# Patient Record
Sex: Female | Born: 1985 | Race: White | Hispanic: No | Smoking: Current every day smoker
Health system: Southern US, Community
[De-identification: ages and names within clinical notes are randomized; demographics above are authoritative.]

## PROBLEM LIST (undated history)

## (undated) DIAGNOSIS — K219 Gastro-esophageal reflux disease without esophagitis: Secondary | ICD-10-CM

## (undated) DIAGNOSIS — G932 Benign intracranial hypertension: Secondary | ICD-10-CM

## (undated) HISTORY — PX: OTHER SURGICAL HISTORY: SHX169

---
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
## 2010-10-12 ENCOUNTER — Inpatient Hospital Stay (HOSPITAL_COMMUNITY)
Admission: AD | Admit: 2010-10-12 | Discharge: 2010-10-16 | Payer: Self-pay | Source: Home / Self Care | Attending: Obstetrics and Gynecology | Admitting: Obstetrics and Gynecology

## 2010-10-14 ENCOUNTER — Encounter (INDEPENDENT_AMBULATORY_CARE_PROVIDER_SITE_OTHER): Payer: Self-pay | Admitting: Obstetrics & Gynecology

## 2010-10-15 LAB — CBC
HCT: 22 % — ABNORMAL LOW (ref 36.0–46.0)
Hemoglobin: 7.1 g/dL — ABNORMAL LOW (ref 12.0–15.0)
MCH: 28.7 pg (ref 26.0–34.0)
MCHC: 32.3 g/dL (ref 30.0–36.0)
MCV: 89.1 fL (ref 78.0–100.0)
Platelets: 179 10*3/uL (ref 150–400)
RBC: 2.47 MIL/uL — ABNORMAL LOW (ref 3.87–5.11)
RDW: 14.8 % (ref 11.5–15.5)
WBC: 14.5 10*3/uL — ABNORMAL HIGH (ref 4.0–10.5)

## 2010-10-25 LAB — CBC
HCT: 22.2 % — ABNORMAL LOW (ref 36.0–46.0)
Hemoglobin: 7.2 g/dL — ABNORMAL LOW (ref 12.0–15.0)
MCH: 28.9 pg (ref 26.0–34.0)
MCHC: 32.4 g/dL (ref 30.0–36.0)
MCV: 89.2 fL (ref 78.0–100.0)
Platelets: 218 10*3/uL (ref 150–400)
RBC: 2.49 MIL/uL — ABNORMAL LOW (ref 3.87–5.11)
RDW: 14.8 % (ref 11.5–15.5)
WBC: 11.8 10*3/uL — ABNORMAL HIGH (ref 4.0–10.5)

## 2010-11-05 NOTE — Discharge Summary (Signed)
Paula Burgess, Paula Burgess              ACCOUNT NO.:  [id number]  MEDICAL RECORD NO.:  [id number]          PATIENT TYPE:  INP  LOCATION:  9132                          FACILITY:  [facility]  PHYSICIAN:  Lenoard Aden, M.D.DATE OF BIRTH:  Dec 07, 1985  DATE OF ADMISSION:  10/12/2010 DATE OF DISCHARGE:  10/16/2010                              DISCHARGE SUMMARY   DIAGNOSES:  A 39 weeks' gestation, asymmetric intrauterine growth restriction, oligohydramnios.  DISCHARGE DIAGNOSES:  Postoperative day 2, status post cesarean section, acute blood loss anemia.  PATIENT HISTORY:  The patient is a 25 year old, gravida 1, para 0, at 38 weeks and 6 days with an [facility] of October 19, 2010.  Prenatal care at [facility] since 7 weeks' gestation with Dr. Billy Coast as primary.  Prenatal labs include A positive, rubella positive, HIV negative, RPR negative, hepatitis B negative and GBS positive.  Prenatal course has been complicated by IUGR at the 15th percentile with a 2-week lag and AC down to the 1%.  AFI on day of admission 5.0 consistent with oligohydramnios.  The patient has no significant medical and surgical history.  The patient has no known drug allergies and current medications include a prenatal vitamin 1 tablet daily.  PRESENTATION:  The patient presents for two stage induction of labor  with cervical ripening followed by Pitocin low-dose protocol. Fetal heart rate tracing was reactive and cervical exam is 1-2-cm, 50% vertex at a -2 station.  Admission CBC; white blood cell count 11.6, hemoglobin 11.3, hematocrit 34.1 and a platelet count of 266.  Cervidil was placed on the p.m. of admission.  On the following morning at 0800, artificial rupture of membranes with scant amount of fluid return.  The patient is 2-cm, 60% vertex at -1 station.  The patient is initiated on penicillin protocol for positive GBS intrapartum. Onset of active labor noted at 1300 with epidural anesthesia for pain  management. Active labor progression at 2120 to anterior cervical rim with persistent fetal heart rate variable decels. Decision is made to proceed to cesarean section at 2320 due to fetal heart rate decels - nonreassuring fetal heart rate pattern.   PROCEDURE: Cesarean section by Dr. Billy Coast on October 14, 2010, for nonreassuring fetal heart rate tracing, delivery of a female at 0022, weight at 6 pounds 6 ounces, cord pH was 7.36 and Apgar scores of 9 and 9, newborn is transferred to the newborn nursery. See operative report for further details.  POSTOPERATIVE CARE:  The patient's postoperative care was uneventful. Postoperative CBC; white blood cell count 11.8, hemoglobin 7.2, hematocrit 22.2 and a platelet count of 218 on October 16, 2009. Physical exam was within normal limits.  The patient remains afebrile during hospital course.  Output is negative 400 at the time of discharge.  Incision is well approximated with staples.  No erythema, no ecchymosis, no drainage from the site.  The patient is breast-feeding.  DISCHARGE CARE:  The patient is discharged home on postoperative day 2 in stable condition.  Staples were intact for removal and [facility] in 4-5 days.  The patient is on a regular diet.  ACTIVITY:  Per postoperative  restrictions x2 weeks.  DISCHARGE INSTRUCTIONS:  Per [facility] discharge booklet. MEDICATION: 1) prenatal vitamin 1 tablet p.o. daily 2) ibuprofen 800mg  p.o. q.8 h. as needed for discomfort p.r.n. 3) Percocet 1-2 tablets every 4 h. p.r.n. for pain  4) iron 150 mg, Niferex p.o. daily.     Marlinda Mike, C.N.M.   ______________________________ Lenoard Aden, M.D.    TB/MEDQ  D:  10/21/2010  T:  10/22/2010  Job:  [id number]  Electronically Signed by Marlinda Mike C.N.M. on 10/25/2010 10:43:06 PM Electronically Signed by Olivia Mackie M.D. on 11/05/2010 02:24:02 PM

## 2010-12-20 LAB — CBC
HCT: 34.1 % — ABNORMAL LOW (ref 36.0–46.0)
Hemoglobin: 11.3 g/dL — ABNORMAL LOW (ref 12.0–15.0)
MCH: 29 pg (ref 26.0–34.0)
MCHC: 33.1 g/dL (ref 30.0–36.0)
MCV: 87.7 fL (ref 78.0–100.0)
Platelets: 266 10*3/uL (ref 150–400)
RBC: 3.89 MIL/uL (ref 3.87–5.11)
RDW: 14.2 % (ref 11.5–15.5)
WBC: 11.6 10*3/uL — ABNORMAL HIGH (ref 4.0–10.5)

## 2010-12-20 LAB — RPR: RPR Ser Ql: NONREACTIVE

## 2011-03-22 ENCOUNTER — Other Ambulatory Visit: Payer: Self-pay | Admitting: Obstetrics and Gynecology

## 2013-07-16 LAB — OB RESULTS CONSOLE RPR: RPR: NONREACTIVE

## 2013-07-16 LAB — OB RESULTS CONSOLE ABO/RH: RH Type: POSITIVE

## 2013-07-16 LAB — OB RESULTS CONSOLE RUBELLA ANTIBODY, IGM: Rubella: NON-IMMUNE/NOT IMMUNE

## 2013-07-16 LAB — OB RESULTS CONSOLE HIV ANTIBODY (ROUTINE TESTING): HIV: NONREACTIVE

## 2013-07-16 LAB — OB RESULTS CONSOLE ANTIBODY SCREEN: Antibody Screen: NEGATIVE

## 2013-07-16 LAB — OB RESULTS CONSOLE HEPATITIS B SURFACE ANTIGEN: Hepatitis B Surface Ag: NEGATIVE

## 2014-01-14 ENCOUNTER — Other Ambulatory Visit: Payer: Self-pay | Admitting: Obstetrics and Gynecology

## 2014-01-30 ENCOUNTER — Encounter (HOSPITAL_COMMUNITY): Payer: Self-pay | Admitting: Pharmacist

## 2014-02-10 ENCOUNTER — Encounter (HOSPITAL_COMMUNITY): Payer: Self-pay

## 2014-02-10 ENCOUNTER — Encounter (HOSPITAL_COMMUNITY)
Admission: RE | Admit: 2014-02-10 | Discharge: 2014-02-10 | Disposition: A | Discharge status: Home / Self Care | Payer: 59 | Source: Ambulatory Visit | Attending: Obstetrics and Gynecology | Admitting: Obstetrics and Gynecology

## 2014-02-10 HISTORY — DX: Gastro-esophageal reflux disease without esophagitis: K21.9

## 2014-02-10 LAB — TYPE AND SCREEN
ABO/RH(D): A POS
Antibody Screen: NEGATIVE

## 2014-02-10 LAB — CBC
HCT: 34 % — ABNORMAL LOW (ref 36.0–46.0)
Hemoglobin: 11.5 g/dL — ABNORMAL LOW (ref 12.0–15.0)
MCH: 30.6 pg (ref 26.0–34.0)
MCHC: 33.8 g/dL (ref 30.0–36.0)
MCV: 90.4 fL (ref 78.0–100.0)
Platelets: 203 10*3/uL (ref 150–400)
RBC: 3.76 MIL/uL — ABNORMAL LOW (ref 3.87–5.11)
RDW: 14.4 % (ref 11.5–15.5)
WBC: 11 10*3/uL — ABNORMAL HIGH (ref 4.0–10.5)

## 2014-02-10 LAB — ABO/RH: ABO/RH(D): A POS

## 2014-02-10 LAB — RPR

## 2014-02-10 NOTE — Patient Instructions (Signed)
[place]  02/10/2014   Your procedure is scheduled on: 02/12/14  Enter through the Main Entrance of [facility] [place] up the phone at the desk and dial [phone].   Call this number if you have problems the morning of surgery: [phone]   Remember:   Do not eat food:After Midnight.  Do not drink clear liquids: 4 Hours before arrival.  Take these medicines the morning of surgery with A SIP OF WATER: Zantac   Do not wear jewelry, make-up or nail polish.  Do not wear lotions, powders, or perfumes. You may wear deodorant.  Do not shave 48 hours prior to surgery.  Do not bring valuables to the hospital.  [facility] is not   responsible for any belongings or valuables brought to the hospital.  Contacts, dentures or bridgework may not be worn into surgery.  Leave suitcase in the car. After surgery it may be brought to your room.  For patients admitted to the hospital, checkout time is 11:00 AM the day of              discharge.   Patients discharged the day of surgery will not be allowed to drive             home.  Name and phone number of your driver: NA  Special Instructions:      Please read over the following fact sheets that you were given:   Surgical Site Infection Prevention

## 2014-02-11 NOTE — Consult Note (Signed)
NAMEKENOSHA, DOSTER NO.:  [id number]  MEDICAL RECORD NO.:  [id number]  LOCATION:                                 FACILITY:  PHYSICIAN:  Lovenia Kim, M.D.DATE OF BIRTH:  05/07/1986  DATE OF CONSULTATION: DATE OF DISCHARGE:                                CONSULTATION   CHIEF COMPLAINT:  Previous C-section for repeat.  HISTORY OF PRESENT ILLNESS:  This is a 28 year old white female, G2, P1, at [redacted] weeks gestation for repeat low-segment transverse cesarean section.  She has a family history remarkable for chronic hypertension, breast cancer, diabetes, multiple myeloma.  ALLERGIES:  No known drug allergies.  MEDICATIONS:  Prenatal vitamins and Zantac.  PAST SURGICAL HISTORY:  Previous C-section in 2012 and hernia repair.  PRENATAL COURSE:  Uncomplicated.  PHYSICAL EXAMINATION:  GENERAL:  She is a well-developed, well-nourished white female, in no acute distress. HEENT:  Normal. NECK:  Supple with full range of motion. LUNGS:  Clear. HEART:  Regular rate and rhythm. ABDOMEN:  Soft, gravid, nontender.  Cervix is closed 50%, vertex, -2. EXTREMITIES:  There are no cords. NEUROLOGIC:  Nonfocal. SKIN:  Intact.  IMPRESSION:  This is a 39-week intrauterine pregnancy for repeat C- section.  PLAN:  Proceed with repeat low-segment transverse cesarean section. Risks of anesthesia, infection, bleeding, injury to surrounding organs, possible need for repair was discussed, delayed versus immediate complications to include bowel and bladder injury noted.  The patient acknowledges and wishes to proceed.     Lovenia Kim, M.D.     RJT/MEDQ  D:  02/11/2014  T:  02/11/2014  Job:  [id number]

## 2014-02-12 ENCOUNTER — Encounter (HOSPITAL_COMMUNITY): Payer: Self-pay

## 2014-02-12 ENCOUNTER — Encounter (HOSPITAL_COMMUNITY)
Admission: AD | Disposition: A | Discharge status: Home / Self Care | Payer: Self-pay | Source: Ambulatory Visit | Attending: Obstetrics and Gynecology

## 2014-02-12 ENCOUNTER — Encounter (HOSPITAL_COMMUNITY): Payer: 59 | Admitting: Certified Registered"

## 2014-02-12 ENCOUNTER — Inpatient Hospital Stay (HOSPITAL_COMMUNITY): Payer: 59 | Admitting: Certified Registered"

## 2014-02-12 ENCOUNTER — Inpatient Hospital Stay (HOSPITAL_COMMUNITY)
Admission: AD | Admit: 2014-02-12 | Discharge: 2014-02-14 | DRG: 766 | Disposition: A | Discharge status: Home / Self Care | Payer: 59 | Source: Ambulatory Visit | Attending: Obstetrics and Gynecology | Admitting: Obstetrics and Gynecology

## 2014-02-12 DIAGNOSIS — E669 Obesity, unspecified: Secondary | ICD-10-CM | POA: Diagnosis present

## 2014-02-12 DIAGNOSIS — Z833 Family history of diabetes mellitus: Secondary | ICD-10-CM

## 2014-02-12 DIAGNOSIS — O99214 Obesity complicating childbirth: Secondary | ICD-10-CM

## 2014-02-12 DIAGNOSIS — O99334 Smoking (tobacco) complicating childbirth: Secondary | ICD-10-CM | POA: Diagnosis present

## 2014-02-12 DIAGNOSIS — O34219 Maternal care for unspecified type scar from previous cesarean delivery: Principal | ICD-10-CM | POA: Diagnosis present

## 2014-02-12 DIAGNOSIS — Z6838 Body mass index (BMI) 38.0-38.9, adult: Secondary | ICD-10-CM

## 2014-02-12 DIAGNOSIS — Z803 Family history of malignant neoplasm of breast: Secondary | ICD-10-CM

## 2014-02-12 SURGERY — Surgical Case
Anesthesia: Spinal | Site: Abdomen

## 2014-02-12 MED ORDER — ONDANSETRON HCL 4 MG/2ML IJ SOLN: 4.0000 mg | Freq: Three times a day (TID) | INTRAMUSCULAR | Status: DC | PRN

## 2014-02-12 MED ORDER — MORPHINE SULFATE (PF) 0.5 MG/ML IJ SOLN
INTRAMUSCULAR | Status: DC | PRN
  Administered 2014-02-12: .15 mg via INTRATHECAL

## 2014-02-12 MED ORDER — SIMETHICONE 80 MG PO CHEW
80.0000 mg | CHEWABLE_TABLET | ORAL | Status: DC
  Administered 2014-02-13 (×2): 80 mg via ORAL
  Filled 2014-02-12 (×2): qty 1

## 2014-02-12 MED ORDER — SIMETHICONE 80 MG PO CHEW: 80.0000 mg | CHEWABLE_TABLET | ORAL | Status: DC | PRN

## 2014-02-12 MED ORDER — SCOPOLAMINE 1 MG/3DAYS TD PT72
1.0000 | MEDICATED_PATCH | Freq: Once | TRANSDERMAL | Status: DC
  Administered 2014-02-12: 1.5 mg via TRANSDERMAL

## 2014-02-12 MED ORDER — LACTATED RINGERS IV SOLN
Freq: Once | INTRAVENOUS | Status: AC
  Administered 2014-02-12: 12:00:00 via INTRAVENOUS

## 2014-02-12 MED ORDER — IBUPROFEN 600 MG PO TABS
600.0000 mg | ORAL_TABLET | Freq: Four times a day (QID) | ORAL | Status: DC
  Administered 2014-02-13 – 2014-02-14 (×7): 600 mg via ORAL
  Filled 2014-02-12 (×7): qty 1

## 2014-02-12 MED ORDER — NALBUPHINE HCL 10 MG/ML IJ SOLN
5.0000 mg | INTRAMUSCULAR | Status: DC | PRN
  Administered 2014-02-12 (×2): 5 mg via SUBCUTANEOUS

## 2014-02-12 MED ORDER — DIPHENHYDRAMINE HCL 50 MG/ML IJ SOLN: 12.5000 mg | INTRAMUSCULAR | Status: DC | PRN

## 2014-02-12 MED ORDER — SODIUM CHLORIDE 0.9 % IJ SOLN: 3.0000 mL | INTRAMUSCULAR | Status: DC | PRN

## 2014-02-12 MED ORDER — CEFAZOLIN SODIUM-DEXTROSE 2-3 GM-% IV SOLR
INTRAVENOUS | Status: AC
  Filled 2014-02-12: qty 50

## 2014-02-12 MED ORDER — TETANUS-DIPHTH-ACELL PERTUSSIS 5-2.5-18.5 LF-MCG/0.5 IM SUSP: 0.5000 mL | Freq: Once | INTRAMUSCULAR | Status: DC

## 2014-02-12 MED ORDER — SIMETHICONE 80 MG PO CHEW
80.0000 mg | CHEWABLE_TABLET | Freq: Three times a day (TID) | ORAL | Status: DC
  Administered 2014-02-13 (×3): 80 mg via ORAL
  Filled 2014-02-12 (×4): qty 1

## 2014-02-12 MED ORDER — KETOROLAC TROMETHAMINE 30 MG/ML IJ SOLN: 30.0000 mg | Freq: Four times a day (QID) | INTRAMUSCULAR | Status: AC | PRN

## 2014-02-12 MED ORDER — SENNOSIDES-DOCUSATE SODIUM 8.6-50 MG PO TABS
2.0000 | ORAL_TABLET | ORAL | Status: DC
  Administered 2014-02-13 (×2): 2 via ORAL
  Filled 2014-02-12 (×2): qty 2

## 2014-02-12 MED ORDER — METHYLERGONOVINE MALEATE 0.2 MG/ML IJ SOLN: 0.2000 mg | INTRAMUSCULAR | Status: DC | PRN

## 2014-02-12 MED ORDER — LACTATED RINGERS IV SOLN: INTRAVENOUS | Status: DC

## 2014-02-12 MED ORDER — BUPIVACAINE HCL (PF) 0.25 % IJ SOLN
INTRAMUSCULAR | Status: AC
  Filled 2014-02-12: qty 30

## 2014-02-12 MED ORDER — BUPIVACAINE HCL (PF) 0.25 % IJ SOLN
INTRAMUSCULAR | Status: DC | PRN
  Administered 2014-02-12: 10 mL

## 2014-02-12 MED ORDER — DIPHENHYDRAMINE HCL 50 MG/ML IJ SOLN: 25.0000 mg | INTRAMUSCULAR | Status: DC | PRN

## 2014-02-12 MED ORDER — KETOROLAC TROMETHAMINE 30 MG/ML IJ SOLN
30.0000 mg | Freq: Four times a day (QID) | INTRAMUSCULAR | Status: AC | PRN
  Administered 2014-02-12: 30 mg via INTRAVENOUS

## 2014-02-12 MED ORDER — PRENATAL MULTIVITAMIN CH
1.0000 | ORAL_TABLET | Freq: Every day | ORAL | Status: DC
  Administered 2014-02-13 – 2014-02-14 (×2): 1 via ORAL
  Filled 2014-02-12 (×2): qty 1

## 2014-02-12 MED ORDER — NALBUPHINE HCL 10 MG/ML IJ SOLN: 5.0000 mg | INTRAMUSCULAR | Status: DC | PRN

## 2014-02-12 MED ORDER — OXYCODONE-ACETAMINOPHEN 5-325 MG PO TABS
1.0000 | ORAL_TABLET | ORAL | Status: DC | PRN
  Administered 2014-02-13 – 2014-02-14 (×6): 1 via ORAL
  Filled 2014-02-12 (×6): qty 1

## 2014-02-12 MED ORDER — FENTANYL CITRATE 0.05 MG/ML IJ SOLN
INTRAMUSCULAR | Status: DC | PRN
  Administered 2014-02-12: 25 ug via INTRATHECAL

## 2014-02-12 MED ORDER — BUPIVACAINE IN DEXTROSE 0.75-8.25 % IT SOLN
INTRATHECAL | Status: DC | PRN
  Administered 2014-02-12: 1.5 mL via INTRATHECAL

## 2014-02-12 MED ORDER — LACTATED RINGERS IV SOLN
INTRAVENOUS | Status: DC
  Administered 2014-02-12 (×2): via INTRAVENOUS

## 2014-02-12 MED ORDER — METOCLOPRAMIDE HCL 5 MG/ML IJ SOLN: 10.0000 mg | Freq: Three times a day (TID) | INTRAMUSCULAR | Status: DC | PRN

## 2014-02-12 MED ORDER — OXYTOCIN 40 UNITS IN LACTATED RINGERS INFUSION - SIMPLE MED
62.5000 mL/h | INTRAVENOUS | Status: AC
Start: 2014-02-12 — End: 2014-02-13

## 2014-02-12 MED ORDER — LANOLIN HYDROUS EX OINT: 1.0000 "application " | TOPICAL_OINTMENT | CUTANEOUS | Status: DC | PRN

## 2014-02-12 MED ORDER — ONDANSETRON HCL 4 MG/2ML IJ SOLN: 4.0000 mg | INTRAMUSCULAR | Status: DC | PRN

## 2014-02-12 MED ORDER — NALOXONE HCL 1 MG/ML IJ SOLN
1.0000 ug/kg/h | INTRAVENOUS | Status: DC | PRN
  Filled 2014-02-12: qty 2

## 2014-02-12 MED ORDER — SCOPOLAMINE 1 MG/3DAYS TD PT72: 1.0000 | MEDICATED_PATCH | Freq: Once | TRANSDERMAL | Status: DC

## 2014-02-12 MED ORDER — DIBUCAINE 1 % RE OINT: 1.0000 "application " | TOPICAL_OINTMENT | RECTAL | Status: DC | PRN

## 2014-02-12 MED ORDER — ZOLPIDEM TARTRATE 5 MG PO TABS: 5.0000 mg | ORAL_TABLET | Freq: Every evening | ORAL | Status: DC | PRN

## 2014-02-12 MED ORDER — DIPHENHYDRAMINE HCL 25 MG PO CAPS
25.0000 mg | ORAL_CAPSULE | ORAL | Status: DC | PRN
  Administered 2014-02-12: 25 mg via ORAL
  Filled 2014-02-12 (×2): qty 1

## 2014-02-12 MED ORDER — ONDANSETRON HCL 4 MG PO TABS: 4.0000 mg | ORAL_TABLET | ORAL | Status: DC | PRN

## 2014-02-12 MED ORDER — OXYTOCIN 10 UNIT/ML IJ SOLN
40.0000 [IU] | INTRAVENOUS | Status: DC | PRN
  Administered 2014-02-12: 40 [IU] via INTRAVENOUS

## 2014-02-12 MED ORDER — SCOPOLAMINE 1 MG/3DAYS TD PT72
MEDICATED_PATCH | TRANSDERMAL | Status: AC
  Administered 2014-02-12: 1.5 mg via TRANSDERMAL
  Filled 2014-02-12: qty 1

## 2014-02-12 MED ORDER — NALOXONE HCL 0.4 MG/ML IJ SOLN: 0.4000 mg | INTRAMUSCULAR | Status: DC | PRN

## 2014-02-12 MED ORDER — ONDANSETRON HCL 4 MG/2ML IJ SOLN
INTRAMUSCULAR | Status: DC | PRN
  Administered 2014-02-12: 4 mg via INTRAVENOUS

## 2014-02-12 MED ORDER — CEFAZOLIN SODIUM-DEXTROSE 2-3 GM-% IV SOLR
2.0000 g | INTRAVENOUS | Status: AC
  Administered 2014-02-12: 2 g via INTRAVENOUS

## 2014-02-12 MED ORDER — FENTANYL CITRATE 0.05 MG/ML IJ SOLN
25.0000 ug | INTRAMUSCULAR | Status: DC | PRN
  Administered 2014-02-12 (×2): 50 ug via INTRAVENOUS

## 2014-02-12 MED ORDER — LACTATED RINGERS IV SOLN
INTRAVENOUS | Status: DC | PRN
  Administered 2014-02-12: 14:00:00 via INTRAVENOUS

## 2014-02-12 MED ORDER — MEPERIDINE HCL 25 MG/ML IJ SOLN: 6.2500 mg | INTRAMUSCULAR | Status: DC | PRN

## 2014-02-12 MED ORDER — MENTHOL 3 MG MT LOZG: 1.0000 | LOZENGE | OROMUCOSAL | Status: DC | PRN

## 2014-02-12 MED ORDER — METHYLERGONOVINE MALEATE 0.2 MG PO TABS: 0.2000 mg | ORAL_TABLET | ORAL | Status: DC | PRN

## 2014-02-12 MED ORDER — PHENYLEPHRINE 8 MG IN D5W 100 ML (0.08MG/ML) PREMIX OPTIME
INJECTION | INTRAVENOUS | Status: DC | PRN
  Administered 2014-02-12: 60 ug/min via INTRAVENOUS

## 2014-02-12 MED ORDER — DIPHENHYDRAMINE HCL 25 MG PO CAPS: 25.0000 mg | ORAL_CAPSULE | Freq: Four times a day (QID) | ORAL | Status: DC | PRN

## 2014-02-12 MED ORDER — WITCH HAZEL-GLYCERIN EX PADS: 1.0000 "application " | MEDICATED_PAD | CUTANEOUS | Status: DC | PRN

## 2014-02-12 SURGICAL SUPPLY — 37 items
CLAMP CORD UMBIL (MISCELLANEOUS) IMPLANT
CLOTH BEACON ORANGE TIMEOUT ST (SAFETY) ×3 IMPLANT
CONTAINER PREFILL 10% NBF 15ML (MISCELLANEOUS) IMPLANT
DERMABOND ADVANCED (GAUZE/BANDAGES/DRESSINGS) ×2
DERMABOND ADVANCED .7 DNX12 (GAUZE/BANDAGES/DRESSINGS) ×1 IMPLANT
DRAPE LG THREE QUARTER DISP (DRAPES) IMPLANT
DRSG OPSITE POSTOP 4X10 (GAUZE/BANDAGES/DRESSINGS) ×3 IMPLANT
DURAPREP 26ML APPLICATOR (WOUND CARE) ×3 IMPLANT
ELECT REM PT RETURN 9FT ADLT (ELECTROSURGICAL) ×3
ELECTRODE REM PT RTRN 9FT ADLT (ELECTROSURGICAL) ×1 IMPLANT
EXTRACTOR VACUUM M CUP 4 TUBE (SUCTIONS) ×2 IMPLANT
EXTRACTOR VACUUM M CUP 4' TUBE (SUCTIONS) ×1
GLOVE BIO SURGEON STRL SZ7.5 (GLOVE) ×3 IMPLANT
GLOVE BIOGEL M 7.0 STRL (GLOVE) ×6 IMPLANT
GOWN STRL REUS W/TWL LRG LVL3 (GOWN DISPOSABLE) ×6 IMPLANT
KIT ABG SYR 3ML LUER SLIP (SYRINGE) IMPLANT
NEEDLE HYPO 25X1 1.5 SAFETY (NEEDLE) ×3 IMPLANT
NEEDLE HYPO 25X5/8 SAFETYGLIDE (NEEDLE) IMPLANT
NEEDLE SPNL 20GX3.5 QUINCKE YW (NEEDLE) IMPLANT
NS IRRIG 1000ML POUR BTL (IV SOLUTION) ×3 IMPLANT
PACK C SECTION WH (CUSTOM PROCEDURE TRAY) ×3 IMPLANT
PAD ABD 8X7 1/2 STERILE (GAUZE/BANDAGES/DRESSINGS) ×3 IMPLANT
STAPLER VISISTAT 35W (STAPLE) IMPLANT
SUT MNCRL 0 VIOLET CTX 36 (SUTURE) ×2 IMPLANT
SUT MNCRL AB 3-0 PS2 27 (SUTURE) ×3 IMPLANT
SUT MON AB 2-0 CT1 27 (SUTURE) ×3 IMPLANT
SUT MON AB-0 CT1 36 (SUTURE) ×6 IMPLANT
SUT MONOCRYL 0 CTX 36 (SUTURE) ×4
SUT PLAIN 0 NONE (SUTURE) IMPLANT
SUT PLAIN 2 0 (SUTURE)
SUT PLAIN 2 0 XLH (SUTURE) ×3 IMPLANT
SUT PLAIN ABS 2-0 CT1 27XMFL (SUTURE) IMPLANT
SYR 20CC LL (SYRINGE) IMPLANT
SYR CONTROL 10ML LL (SYRINGE) ×3 IMPLANT
TOWEL OR 17X24 6PK STRL BLUE (TOWEL DISPOSABLE) ×3 IMPLANT
TRAY FOLEY CATH 14FR (SET/KITS/TRAYS/PACK) ×3 IMPLANT
WATER STERILE IRR 1000ML POUR (IV SOLUTION) ×3 IMPLANT

## 2014-02-12 NOTE — Anesthesia Postprocedure Evaluation (Signed)
  Anesthesia Post-op Note  Patient: Paula Burgess  Procedure(s) Performed: Procedure(s): Repeat CESAREAN SECTION (N/A)  Patient Location: PACU  Anesthesia Type:Spinal  Level of Consciousness: awake, alert  and oriented  Airway and Oxygen Therapy: Patient Spontanous Breathing  Post-op Pain: none  Post-op Assessment: Post-op Vital signs reviewed, Patient's Cardiovascular Status Stable, Respiratory Function Stable, Patent Airway, No signs of Nausea or vomiting, Pain level controlled, No headache and No backache  Post-op Vital Signs: Reviewed and stable  Last Vitals:  Filed Vitals:   02/12/14 1515  BP: 97/58  Pulse: 75  Temp:   Resp: 18    Complications: No apparent anesthesia complications

## 2014-02-12 NOTE — Transfer of Care (Signed)
Immediate Anesthesia Transfer of Care Note  Patient: Paula Burgess  Procedure(s) Performed: Procedure(s): Repeat CESAREAN SECTION (N/A)  Patient Location: PACU  Anesthesia Type:Spinal  Level of Consciousness: awake, alert  and oriented  Airway & Oxygen Therapy: Patient Spontanous Breathing  Post-op Assessment: Report given to PACU RN and Post -op Vital signs reviewed and stable  Post vital signs: Reviewed and stable  Complications: No apparent anesthesia complications

## 2014-02-12 NOTE — Progress Notes (Signed)
Patient ID: Paula Burgess, female   DOB: [birth year], 28 y.o.   MRN: [id number] Patient seen and examined. Consent witnessed and signed. No changes noted. Update completed. CBC    Component Value Date/Time   WBC 11.0* 02/10/2014 1150   RBC 3.76* 02/10/2014 1150   HGB 11.5* 02/10/2014 1150   HCT 34.0* 02/10/2014 1150   PLT 203 02/10/2014 1150   MCV 90.4 02/10/2014 1150   MCH 30.6 02/10/2014 1150   MCHC 33.8 02/10/2014 1150   RDW 14.4 02/10/2014 1150

## 2014-02-12 NOTE — Anesthesia Preprocedure Evaluation (Signed)
Anesthesia Evaluation  Patient identified by MRN, date of birth, ID band Patient awake    Reviewed: Allergy & Precautions, H&P , Patient's Chart, lab work & pertinent test results  Airway Mallampati: II TM Distance: >3 FB Neck ROM: full    Dental no notable dental hx.    Pulmonary Current Smoker,  breath sounds clear to auscultation  Pulmonary exam normal       Cardiovascular Exercise Tolerance: Good Rhythm:regular Rate:Normal     Neuro/Psych    GI/Hepatic   Endo/Other  Morbid obesity  Renal/GU      Musculoskeletal   Abdominal   Peds  Hematology   Anesthesia Other Findings   Reproductive/Obstetrics                           Anesthesia Physical Anesthesia Plan  ASA: III  Anesthesia Plan: Spinal   Post-op Pain Management:    Induction:   Airway Management Planned:   Additional Equipment:   Intra-op Plan:   Post-operative Plan:   Informed Consent: I have reviewed the patients History and Physical, chart, labs and discussed the procedure including the risks, benefits and alternatives for the proposed anesthesia with the patient or authorized representative who has indicated his/her understanding and acceptance.   Dental Advisory Given  Plan Discussed with: CRNA  Anesthesia Plan Comments: (Lab work confirmed with CRNA in room. Platelets okay. Discussed spinal anesthetic, and patient consents to the procedure:  included risk of possible headache,backache, failed block, allergic reaction, and nerve injury. This patient was asked if she had any questions or concerns before the procedure started. )        Anesthesia Quick Evaluation

## 2014-02-12 NOTE — Anesthesia Procedure Notes (Signed)
Spinal  Patient location during procedure: OR Start time: 02/12/2014 1:14 PM Staffing Anesthesiologist: Gabriella Guile A. Performed by: anesthesiologist  Preanesthetic Checklist Completed: patient identified, site marked, surgical consent, pre-op evaluation, timeout performed, IV checked, risks and benefits discussed and monitors and equipment checked Spinal Block Patient position: sitting Prep: site prepped and draped and DuraPrep Patient monitoring: heart rate, cardiac monitor, continuous pulse ox and blood pressure Approach: midline Location: L3-4 Injection technique: single-shot Needle Needle type: Sprotte  Needle gauge: 24 G Needle length: 9 cm Needle insertion depth: 6 cm Assessment Sensory level: T4 Additional Notes Patient tolerated procedure well. Adequate sensory level.

## 2014-02-12 NOTE — Op Note (Signed)
Cesarean Section Procedure Note  Indications: previous uterine incision kerr x one  Pre-operative Diagnosis: 39 week 1 day pregnancy.  Post-operative Diagnosis: same  Surgeon: Lovenia Kim   Assistants: Renato Battles, CNM  Anesthesia: Local anesthesia 0.25.% bupivacaine and Spinal anesthesia  ASA Class: 2  Procedure Details  The patient was seen in the Holding Room. The risks, benefits, complications, treatment options, and expected outcomes were discussed with the patient.  The patient concurred with the proposed plan, giving informed consent. The risks of anesthesia, infection, bleeding and possible injury to other organs discussed. Injury to bowel, bladder, or ureter with possible need for repair discussed. Possible need for transfusion with secondary risks of hepatitis or HIV acquisition discussed. Post operative complications to include but not limited to DVT, PE and Pneumonia noted. The site of surgery properly noted/marked. The patient was taken to Operating Room # 9, identified as Paula Burgess and the procedure verified as C-Section Delivery. A Time Out was held and the above information confirmed.  After induction of anesthesia, the patient was draped and prepped in the usual sterile manner. A Pfannenstiel incision was made and carried down through the subcutaneous tissue to the fascia. Fascial incision was made and extended transversely using Mayo scissors. The fascia was separated from the underlying rectus tissue superiorly and inferiorly. The peritoneum was identified and entered. Peritoneal incision was extended longitudinally. The utero-vesical peritoneal reflection was incised transversely and the bladder flap was bluntly freed from the lower uterine segment. A low transverse uterine incision(Kerr hysterotomy) was made. Delivered from OA presentation with vacuum assistance was a  female with Apgar scores of 8 at one minute and 9 at five minutes. Bulb suctioning gently performed.  Neonatal team in attendance.After the umbilical cord was clamped and cut cord blood was obtained for evaluation. The placenta was removed intact and appeared normal. The uterus was curetted with a dry lap pack. Good hemostasis was noted.The uterine outline, tubes and ovaries appeared normal. The uterine incision was closed with running locked sutures of 0 Monocryl x 2 layers. Hemostasis was observed. Lavage was carried out until clear.The parietal peritoneum was closed with a running 2-0 Monocryl suture. The fascia was then reapproximated with running sutures of 0 Monocryl. The skin was reapproximated with 3-0 monocryl after [place] closure with 2-0 plain.  Instrument, sponge, and needle counts were correct prior the abdominal closure and at the conclusion of the case.   Findings: FTLF, anterior placenta Omental adhesion, right tubal adhesions  Estimated Blood Loss:  300 mL         Drains: foley                 Specimens: placenta                 Complications:  None; patient tolerated the procedure well.         Disposition: PACU - hemodynamically stable.         Condition: stable  Attending Attestation: I performed the procedure.

## 2014-02-13 ENCOUNTER — Encounter (HOSPITAL_COMMUNITY): Payer: Self-pay | Admitting: Obstetrics and Gynecology

## 2014-02-13 LAB — CBC
HCT: 24.9 % — ABNORMAL LOW (ref 36.0–46.0)
Hemoglobin: 8.3 g/dL — ABNORMAL LOW (ref 12.0–15.0)
MCH: 30.2 pg (ref 26.0–34.0)
MCHC: 33.3 g/dL (ref 30.0–36.0)
MCV: 90.5 fL (ref 78.0–100.0)
PLATELETS: 172 10*3/uL (ref 150–400)
RBC: 2.75 MIL/uL — AB (ref 3.87–5.11)
RDW: 14.5 % (ref 11.5–15.5)
WBC: 12.5 10*3/uL — ABNORMAL HIGH (ref 4.0–10.5)

## 2014-02-13 LAB — BIRTH TISSUE RECOVERY COLLECTION (PLACENTA DONATION)

## 2014-02-13 NOTE — Progress Notes (Signed)
Patient ID: Paula Burgess, female   DOB: 1986/09/12, 28 y.o.   MRN: [id number] POD # 1  Subjective: Pt reports feeling well / Pain controlled with ibuprofen and percocet Tolerating po/ Foley d/c'ed and voiding without problems/ No n/v/Flatus pos Activity: out of bed and ambulate Bleeding is light Newborn info:  Information for the patient's newborn:  Varonica, Siharath [[id number]]  female Feeding: breast   Objective: VS: Blood pressure 105/73, pulse 85, temperature 98 F (36.7 C), temperature source Oral, resp. rate 20, weight 100.245 kg (221 lb), SpO2 99.00%, unknown if currently breastfeeding.    Intake/Output Summary (Last 24 hours) at 02/13/14 1123 Last data filed at 02/13/14 0600  Gross per 24 hour  Intake   2065 ml  Output   2100 ml  Net    -35 ml      Recent Labs  02/10/14 1150 02/13/14 0556  WBC 11.0* 12.5*  HGB 11.5* 8.3*  HCT 34.0* 24.9*  PLT 203 172    Blood type: --/--/A POS, A POS (05/04 1150) Rubella: Nonimmune (10/07 1147)    Physical Exam:  General: alert, cooperative and no distress CV: Regular rate and rhythm Resp: clear Abdomen: soft, nontender, normal bowel sounds Incision: Covered with Tegaderm and honeycomb dressing; well approximated. Uterine Fundus: firm, below umbilicus, nontender Lochia: minimal Ext: edema trace and Homans sign is negative, no sign of DVT    A/P: POD # 1 G2P2002 S/P C/Section d/t elective repeat ABL anemia, compounded by chronic anemia Doing well Continue routine post op orders   Signed: Darleen Crocker, MSN, [facility] 02/13/2014, 11:23 AM

## 2014-02-13 NOTE — Addendum Note (Signed)
Addendum created 02/13/14 0750 by Asher Muir, CRNA   Modules edited: Notes Section   Notes Section:  File: [id number]

## 2014-02-13 NOTE — Anesthesia Postprocedure Evaluation (Signed)
Anesthesia Post Note  Patient: Paula Burgess  Procedure(s) Performed: Procedure(s) (LRB): Repeat CESAREAN SECTION (N/A)  Anesthesia type: Spinal  Patient location: Mother/Baby  Post pain: Pain level controlled  Post assessment: Post-op Vital signs reviewed  Last Vitals:  Filed Vitals:   02/13/14 0600  BP: 100/58  Pulse: 72  Temp: 36.6 C  Resp: 20    Post vital signs: Reviewed  Level of consciousness: awake  Complications: No apparent anesthesia complications

## 2014-02-14 LAB — URINALYSIS, ROUTINE W REFLEX MICROSCOPIC
Bilirubin Urine: NEGATIVE
Glucose, UA: NEGATIVE mg/dL
Ketones, ur: NEGATIVE mg/dL
Leukocytes, UA: NEGATIVE
Nitrite: NEGATIVE
Protein, ur: NEGATIVE mg/dL
Specific Gravity, Urine: >1.03 — ABNORMAL HIGH (ref 1.005–1.030)
Urobilinogen, UA: 0.2 mg/dL (ref 0.0–1.0)
pH: 6 (ref 5.0–8.0)

## 2014-02-14 LAB — URINE MICROSCOPIC-ADD ON

## 2014-02-14 MED ORDER — OXYCODONE-ACETAMINOPHEN 5-325 MG PO TABS: 1.0000 | ORAL_TABLET | ORAL | Status: DC | PRN

## 2014-02-14 MED ORDER — NITROFURANTOIN MONOHYD MACRO 100 MG PO CAPS: 100.0000 mg | ORAL_CAPSULE | Freq: Two times a day (BID) | ORAL | Status: AC

## 2014-02-14 MED ORDER — DOCUSATE SODIUM 100 MG PO CAPS: 100.0000 mg | ORAL_CAPSULE | Freq: Two times a day (BID) | ORAL | Status: DC | PRN

## 2014-02-14 MED ORDER — IBUPROFEN 600 MG PO TABS: 600.0000 mg | ORAL_TABLET | Freq: Four times a day (QID) | ORAL | Status: DC | PRN

## 2014-02-14 NOTE — Progress Notes (Signed)
Patient ID: Paula Burgess, female   DOB: 07-11-1986, 28 y.o.   MRN: [id number] POD # 2  Subjective: Pt reports feeling well and desires early d/c home / Pain controlled with ibuprofen and percocet C/O dull pain with urination.  No urgency or frequency and no LBP.  Afebrile. Tolerating po/ No n/v/Flatus pos Activity: out of bed and ambulate Bleeding is light Newborn info:  Information for the patient's newborn:  Janin, Kozlowski [[id number]]  female Feeding: breast   Objective: VS: Blood pressure 94/77, pulse 80, temperature 98.2 F (36.8 C), temperature source Oral, resp. rate 19   LABS:  Recent Labs  02/13/14 0556  WBC 12.5*  HGB 8.3*  PLT 172                           Physical Exam:  General: alert, cooperative and no distress CV: Regular rate and rhythm Resp: clear Abdomen: soft, nontender, hypoactive bowel sounds Incision: Covered with Tegaderm and honeycomb dressing; well approximated. Uterine Fundus: firm, below umbilicus, nontender Lochia: minimal Ext: edema trace and Homans sign is negative, no sign of DVT    A/P: POD # 2 G2P2002/  S/P Elective repeat C/Section ABL Anemia, compounded by chronic anemia; start fe supplement after first BM.  Urged iron rich foods. Will collect UA/ UCS prior to d/c and send rx for macrobid. Doing well and stable for discharge home RX's: Macrobid po BID x 5 days. Ibuprofen 600mg  po Q 6 hrs prn pain #30 Refill x 1 Percocet 5/325 1 - 2 tabs po every 6 hrs prn pain  #30 No refill Colace 100mg  po up to TID prn #30 Ref x 1 Routine pp visit in 6 wks   Signed: Darleen Crocker, MSN, [facility] 02/14/2014, 10:40 AM

## 2014-02-14 NOTE — Discharge Summary (Signed)
  POSTOPERATIVE DISCHARGE SUMMARY:  Patient ID: Paula Burgess MRN: [id number] DOB/AGE: Sep 01, 1986 28 y.o.  Admit date: 02/12/2014 Admission Diagnoses: G2 P1 0 0 1 for elective repeat C/S @ 39w 1d   Discharge date: 02/14/2014 Discharge Diagnoses: S/P Repeat C/S on 02/12/14 ABL Anemia compounded by chronic anemia        Prenatal history: G2P2002   EDC : 02/18/2014, by Other B[facility]  Has received prenatal care at [facility] since [redacted] wks gestation. Primary provider : Dr Ronita Hipps Prenatal course complicated by none  Prenatal labs: ABO, Rh: A Pos Antibody: NEG (05/04 1150) Rubella:   Non Immune RPR: NON REAC (05/04 1150)  HBsAg: Negative (10/07 1147)  HIV: Non-reactive (10/07 1147)  GBS:   Neg GTT: 111  Medical / Surgical History :  Past medical history:  Past Medical History  Diagnosis Date  . GERD (gastroesophageal reflux disease)     Past surgical history:  Past Surgical History  Procedure Laterality Date  . Cesarean section  2012  . Cesarean section N/A 02/12/2014    Procedure: Repeat CESAREAN SECTION;  Surgeon: Lovenia Kim, MD;  Location: [facility];  Service: Obstetrics;  Laterality: N/A;      Allergies: Review of patient's allergies indicates no known allergies.   Physical Exam:   VSS: Blood pressure 94/77, pulse 80, temperature 98.2 F (36.8 C), temperature source Oral, resp. rate 19.  Newborn Data Live born female  Birth Weight: 6 lb 1.9 oz (2775 g) APGAR: 9, 9  See operative report for further details  Home with mother.  Discharge Instructions:  Wound Care: keep clean and dry / remove honeycomb POD 5 Postpartum Instructions: [facility] discharge booklet - instructions reviewed Medications: Urged OTC fe supplement   Medication List         docusate sodium 100 MG capsule  Commonly known as:  COLACE  Take 1 capsule (100 mg total) by mouth 2 (two) times daily as needed.     ibuprofen 600 MG tablet  Commonly known as:  ADVIL,MOTRIN   Take 1 tablet (600 mg total) by mouth every 6 (six) hours as needed.     multivitamin-prenatal 27-0.8 MG Tabs tablet  Take 1 tablet by mouth daily at 12 noon.     oxyCODONE-acetaminophen 5-325 MG per tablet  Commonly known as:  PERCOCET/ROXICET  Take 1-2 tablets by mouth every 4 (four) hours as needed for severe pain (moderate - severe pain).     ranitidine 150 MG tablet  Commonly known as:  ZANTAC  Take 150 mg by mouth daily as needed for heartburn.        Follow-up Information   Follow up with Lovenia Kim, MD.   Specialty:  Obstetrics and Gynecology   Contact information:   [address] [place] [address] [phone]         Signed: Darleen Crocker [facility], MSN 02/14/2014, 10:30

## 2014-02-15 LAB — URINE CULTURE: Colony Count: 1000

## 2014-08-11 ENCOUNTER — Encounter (HOSPITAL_COMMUNITY): Payer: Self-pay | Admitting: Obstetrics and Gynecology

## 2015-06-12 ENCOUNTER — Telehealth: Payer: Self-pay | Admitting: Family Medicine

## 2015-06-12 MED ORDER — PENICILLIN V POTASSIUM 500 MG PO TABS: 500.0000 mg | ORAL_TABLET | Freq: Three times a day (TID) | ORAL | Status: DC

## 2015-06-12 MED ORDER — HYDROCODONE-ACETAMINOPHEN 5-325 MG PO TABS: 1.0000 | ORAL_TABLET | Freq: Four times a day (QID) | ORAL | Status: DC | PRN

## 2015-06-12 NOTE — Telephone Encounter (Signed)
Commonly infxn assox, pen vk 500 tid ten d, use advil or equiv 600 mg tid, may add hydrocod 5/325 mumb 24, one q six hrs prn

## 2015-06-12 NOTE — Telephone Encounter (Signed)
Patient has a tooth ache and wanting something called in. Cant see dentist until next week. Paula Burgess

## 2015-06-12 NOTE — Telephone Encounter (Signed)
Rx for antibiotic sent electronically to pharmacy. Patient notified and stated she will come by office to pick up rx for hydrocodone.

## 2015-10-13 DIAGNOSIS — H5213 Myopia, bilateral: Secondary | ICD-10-CM | POA: Diagnosis not present

## 2015-10-13 DIAGNOSIS — H16421 Pannus (corneal), right eye: Secondary | ICD-10-CM | POA: Diagnosis not present

## 2015-10-13 DIAGNOSIS — H179 Unspecified corneal scar and opacity: Secondary | ICD-10-CM | POA: Diagnosis not present

## 2015-10-13 DIAGNOSIS — H52222 Regular astigmatism, left eye: Secondary | ICD-10-CM | POA: Diagnosis not present

## 2015-10-29 ENCOUNTER — Ambulatory Visit (INDEPENDENT_AMBULATORY_CARE_PROVIDER_SITE_OTHER): Payer: 59 | Admitting: Family Medicine

## 2015-10-29 ENCOUNTER — Encounter: Payer: Self-pay | Admitting: Family Medicine

## 2015-10-29 ENCOUNTER — Ambulatory Visit (HOSPITAL_COMMUNITY)
Admission: RE | Admit: 2015-10-29 | Discharge: 2015-10-29 | Disposition: A | Discharge status: Home / Self Care | Payer: 59 | Source: Ambulatory Visit | Attending: Family Medicine | Admitting: Family Medicine

## 2015-10-29 VITALS — BP 112/74 | Ht 65.0 in | Wt 197.0 lb

## 2015-10-29 DIAGNOSIS — M25551 Pain in right hip: Secondary | ICD-10-CM | POA: Insufficient documentation

## 2015-10-29 IMAGING — DX DG HIP (WITH OR WITHOUT PELVIS) 2-3V*R*
3 series · 3 of 3 positions shown · non-contrast
Comparison: None.

CLINICAL DATA: Generalized right-sided hip pain for 3 months with
no known injury.

EXAM:
DG HIP (WITH OR WITHOUT PELVIS) 2-3V RIGHT

[pelvis ap]
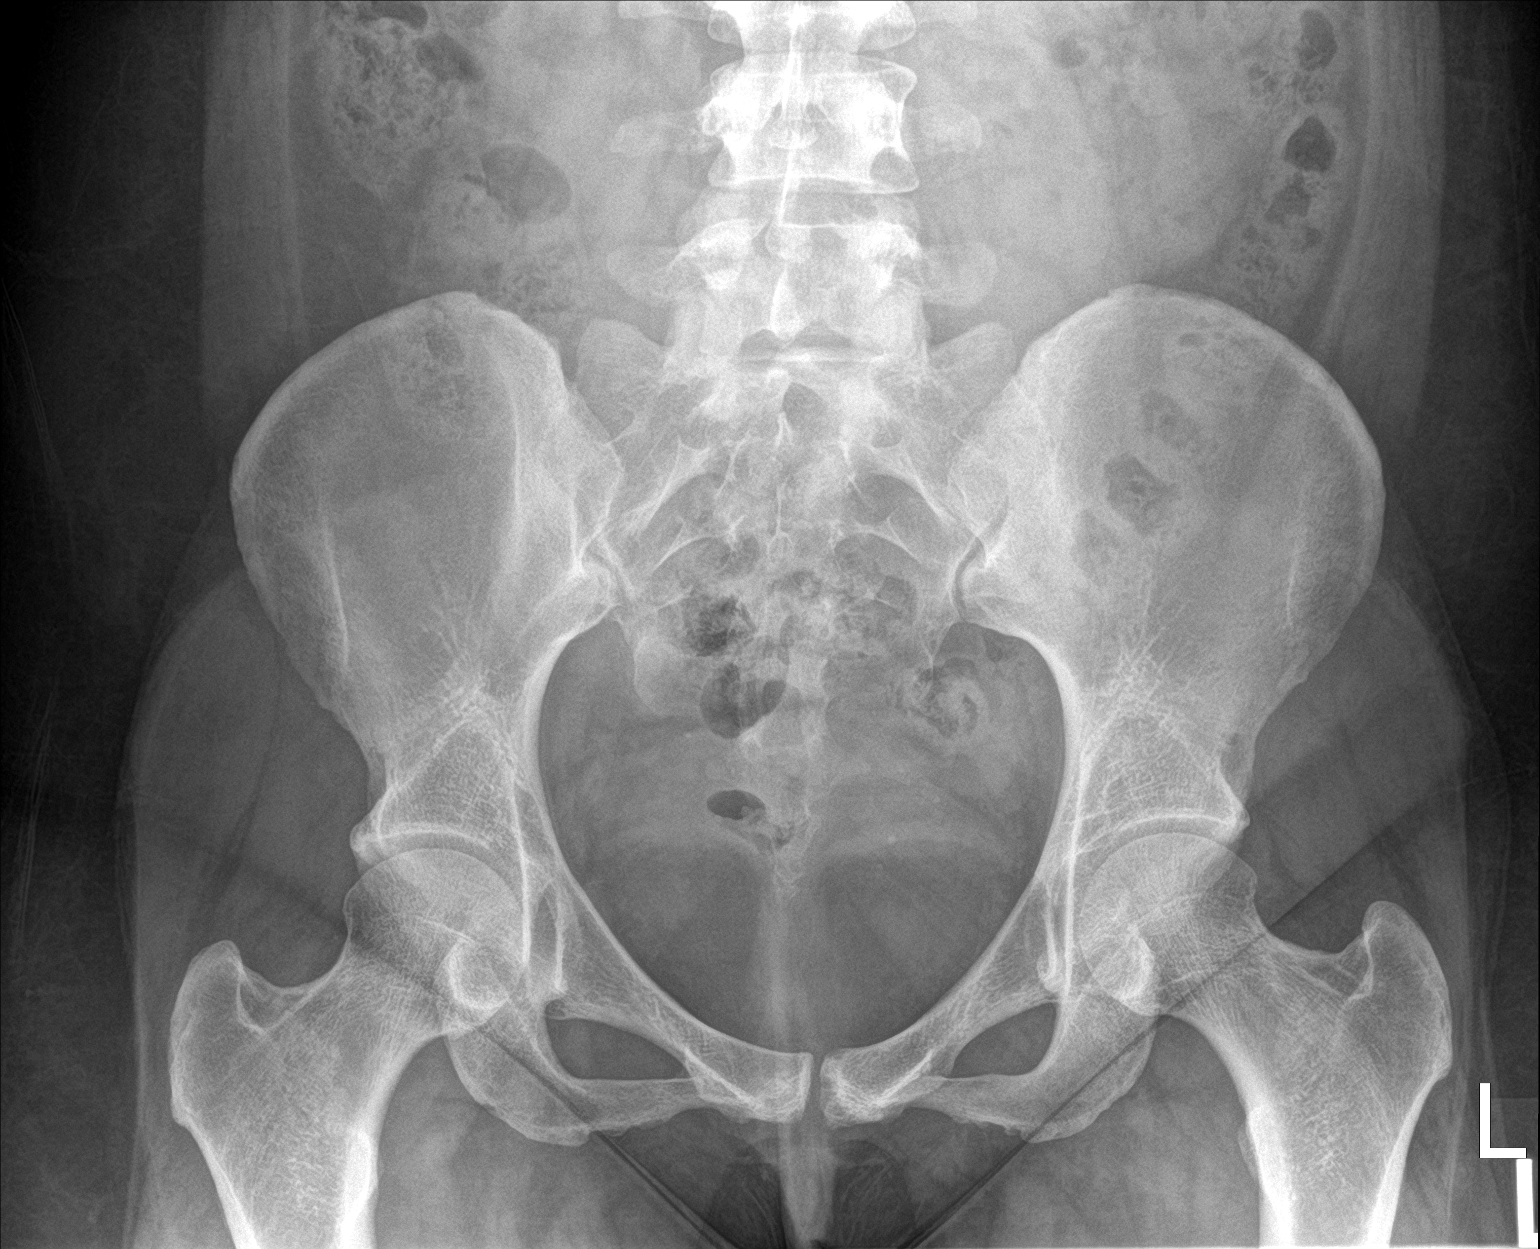

[hip ap]
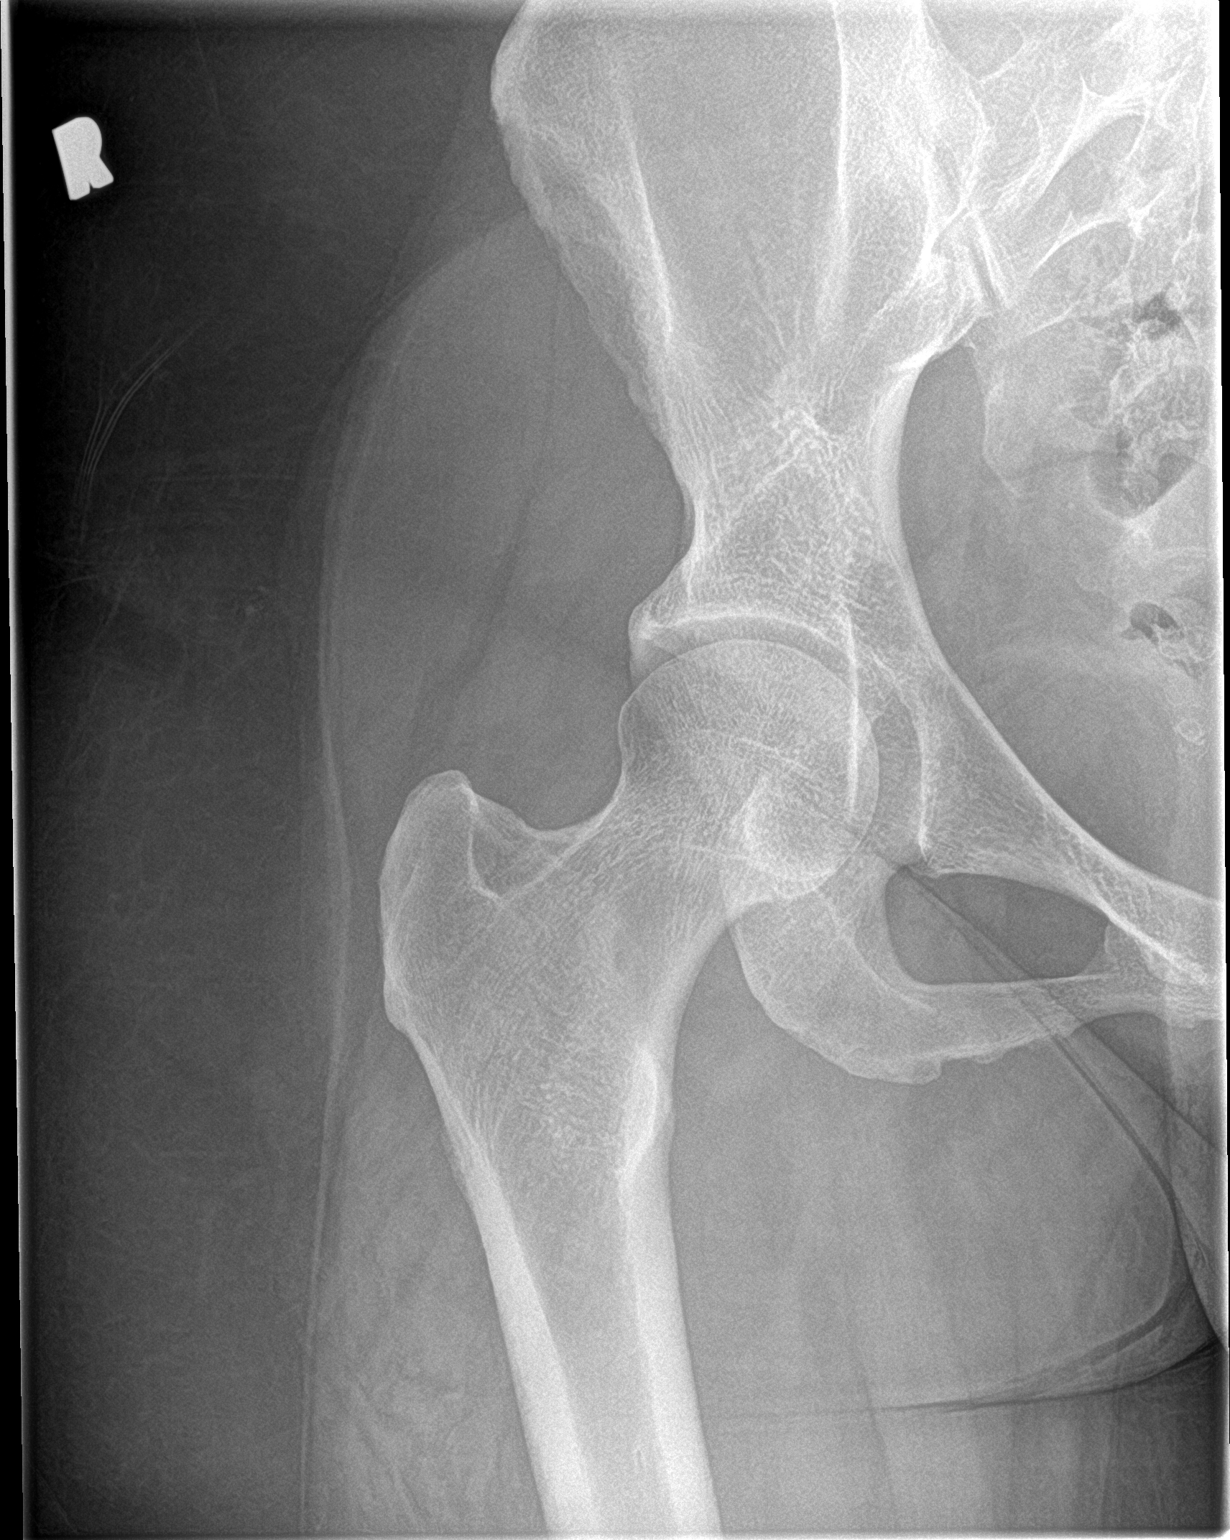

[hip lat]
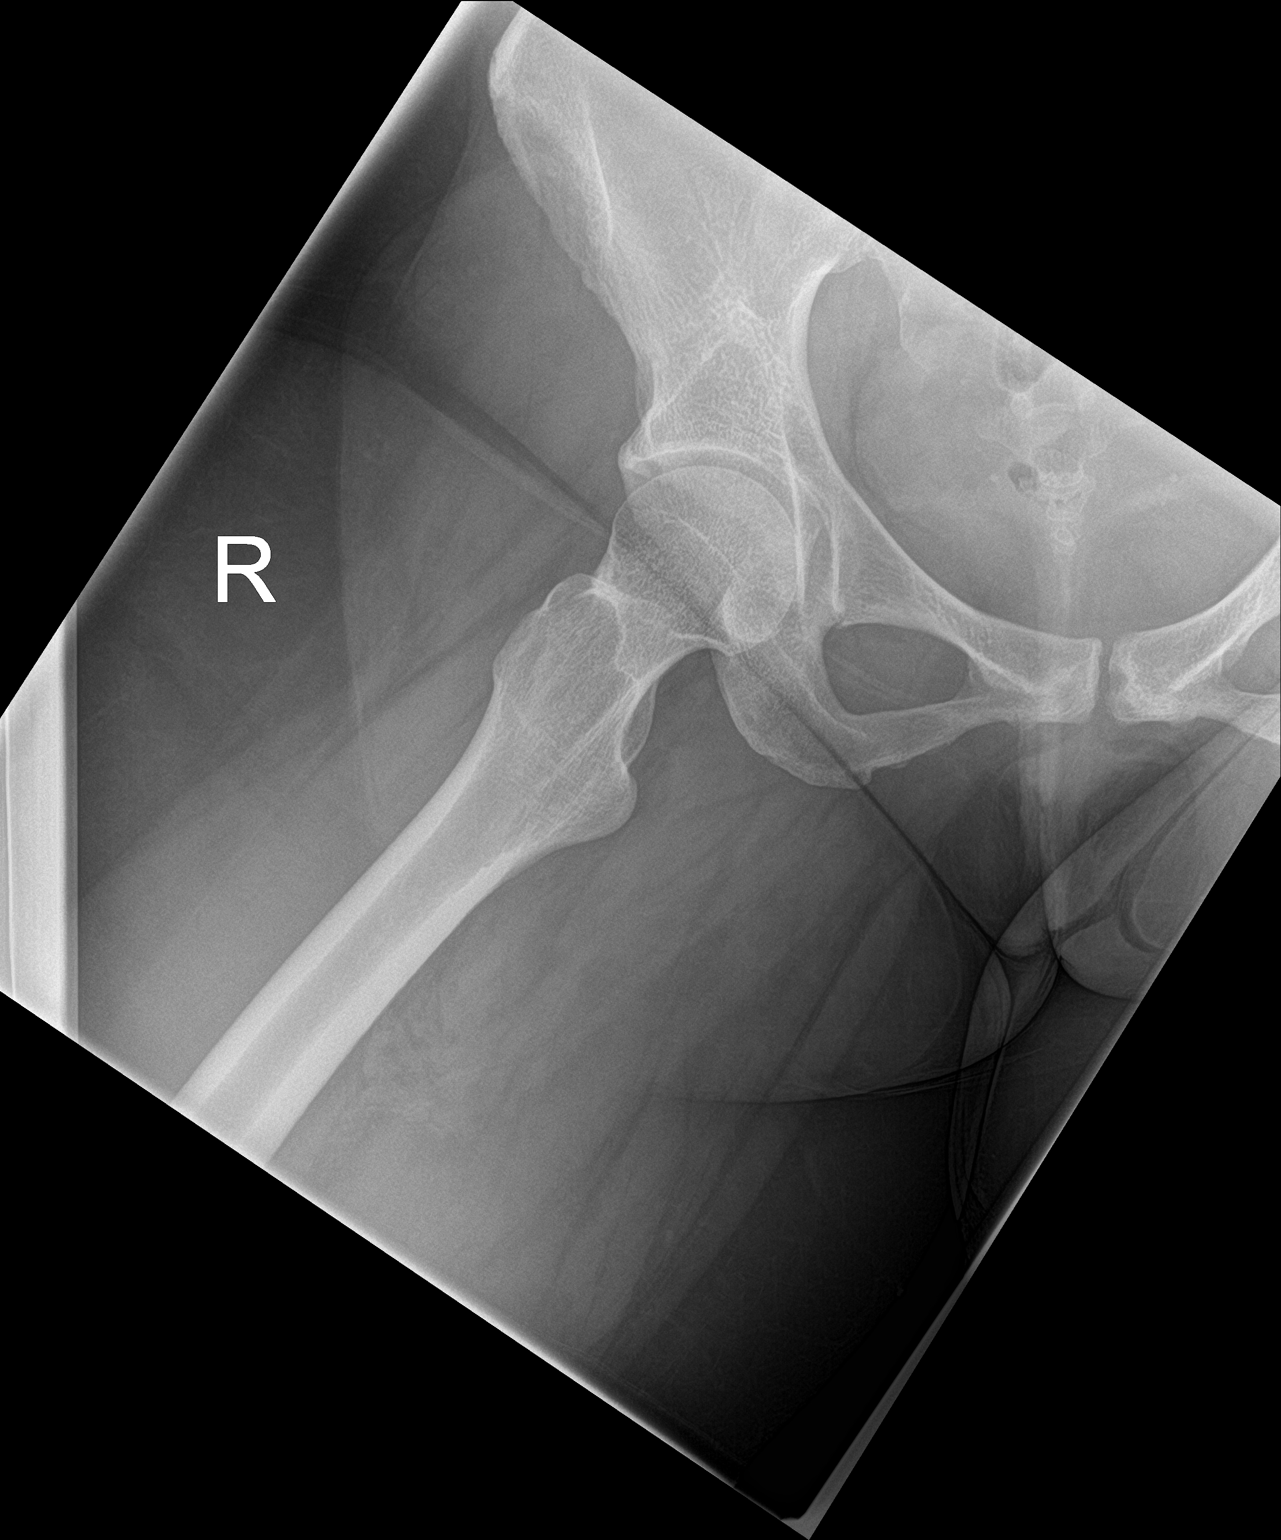

[3 of 3 positions shown; findings below may reference images not displayed]

FINDINGS: There is no evidence of hip fracture or dislocation. There is no
evidence of arthropathy or other focal bone abnormality.
IMPRESSION: Negative.

## 2015-10-29 MED ORDER — DICLOFENAC SODIUM 75 MG PO TBEC: 75.0000 mg | DELAYED_RELEASE_TABLET | Freq: Two times a day (BID) | ORAL | Status: DC

## 2015-10-29 MED FILL — DICLOFENAC SOD EC 75 MG TAB: 75 | 14 days supply | Qty: 28 | Fill #0

## 2015-10-29 NOTE — Progress Notes (Signed)
   Subjective:    Patient ID: Paula Burgess, female    DOB: [birth year], 30 y.o.   MRN: [id number]  Hip Pain   Dull pain in right hip. No known injury. Not tried any treatments. Pain does not radiate.   Deep aching soreness  Aggravating  otc meds occas tylenol   Neg pain in joints usually  Recalls no injury. Deep ache ache right anterior hip. Worse with certain movements. Over-the-counter meds not doing much.     Review of Systems No knee pain no back pain no abdominal pain no fever ROS otherwise negative    Objective:   Physical Exam  Alert vitals stable. Lungs clear heart rare rhythm H&T normal right anterior pelvis tender to palpation along anterior pelvic prominence      Assessment & Plan:  Impression hip pain. Plan trial Voltaren. X-ray to rule out bony pathology pain is related above the joint more like a tendon insertion at the superior pelvis. Range of motion exercises also discussed WSL

## 2015-11-26 DIAGNOSIS — E669 Obesity, unspecified: Secondary | ICD-10-CM | POA: Diagnosis not present

## 2015-11-26 DIAGNOSIS — Z6833 Body mass index (BMI) 33.0-33.9, adult: Secondary | ICD-10-CM | POA: Diagnosis not present

## 2015-11-26 MED FILL — PHENTERMINE 37.5 MG TABLET: 37.5 | 30 days supply | Qty: 30 | Fill #0

## 2015-12-21 DIAGNOSIS — Z6832 Body mass index (BMI) 32.0-32.9, adult: Secondary | ICD-10-CM | POA: Diagnosis not present

## 2015-12-21 DIAGNOSIS — E669 Obesity, unspecified: Secondary | ICD-10-CM | POA: Diagnosis not present

## 2015-12-28 MED FILL — PHENTERMINE 37.5 MG TABLET: 37.5 | 30 days supply | Qty: 30 | Fill #0

## 2016-02-01 MED FILL — PHENTERMINE 37.5 MG TABLET: 37.5 | 30 days supply | Qty: 30 | Fill #1

## 2016-02-22 DIAGNOSIS — E669 Obesity, unspecified: Secondary | ICD-10-CM | POA: Diagnosis not present

## 2016-02-22 DIAGNOSIS — Z6832 Body mass index (BMI) 32.0-32.9, adult: Secondary | ICD-10-CM | POA: Diagnosis not present

## 2016-03-04 MED FILL — PHENTERMINE 37.5 MG TABLET: 37.5 | 30 days supply | Qty: 30 | Fill #0

## 2016-03-17 DIAGNOSIS — Z3046 Encounter for surveillance of implantable subdermal contraceptive: Secondary | ICD-10-CM | POA: Diagnosis not present

## 2016-03-17 MED FILL — ASHLYNA 0.15-0.03-0.01 MG T: 0.15-0.03 & | 90 days supply | Qty: 91 | Fill #0

## 2016-04-04 MED FILL — PHENTERMINE 37.5 MG TABLET: 37.5 | 30 days supply | Qty: 30 | Fill #1

## 2016-05-04 DIAGNOSIS — Z01419 Encounter for gynecological examination (general) (routine) without abnormal findings: Secondary | ICD-10-CM | POA: Diagnosis not present

## 2016-05-04 DIAGNOSIS — N870 Mild cervical dysplasia: Secondary | ICD-10-CM | POA: Diagnosis not present

## 2016-05-04 DIAGNOSIS — Z6832 Body mass index (BMI) 32.0-32.9, adult: Secondary | ICD-10-CM | POA: Diagnosis not present

## 2016-05-16 MED FILL — PHENTERMINE 37.5 MG TABLET: 37.5 | 30 days supply | Qty: 30 | Fill #0

## 2016-06-17 MED FILL — ASHLYNA 0.15-0.03-0.01 MG T: 0.15-0.03 & | 90 days supply | Qty: 91 | Fill #0

## 2016-09-07 MED FILL — PHENTERMINE 37.5 MG TABLET: 37.5 | 30 days supply | Qty: 30 | Fill #1

## 2016-09-07 MED FILL — ASHLYNA 0.15-0.03-0.01 MG T: 0.15-0.03 & | 90 days supply | Qty: 91 | Fill #1

## 2016-11-22 DIAGNOSIS — H52223 Regular astigmatism, bilateral: Secondary | ICD-10-CM | POA: Diagnosis not present

## 2016-11-22 DIAGNOSIS — H17822 Peripheral opacity of cornea, left eye: Secondary | ICD-10-CM | POA: Diagnosis not present

## 2016-11-22 DIAGNOSIS — H16421 Pannus (corneal), right eye: Secondary | ICD-10-CM | POA: Diagnosis not present

## 2016-11-22 DIAGNOSIS — H5213 Myopia, bilateral: Secondary | ICD-10-CM | POA: Diagnosis not present

## 2016-12-05 MED FILL — ASHLYNA 0.15-0.03-0.01 MG T: 0.15-0.03 & | 90 days supply | Qty: 91 | Fill #2

## 2017-02-24 MED FILL — ASHLYNA 0.15-0.03-0.01 MG T: 0.15-0.03 & | 90 days supply | Qty: 91 | Fill #3

## 2017-08-30 DIAGNOSIS — Z6837 Body mass index (BMI) 37.0-37.9, adult: Secondary | ICD-10-CM | POA: Diagnosis not present

## 2017-08-30 DIAGNOSIS — Z01419 Encounter for gynecological examination (general) (routine) without abnormal findings: Secondary | ICD-10-CM | POA: Diagnosis not present

## 2017-08-30 DIAGNOSIS — Z1151 Encounter for screening for human papillomavirus (HPV): Secondary | ICD-10-CM | POA: Diagnosis not present

## 2017-08-30 MED FILL — DAYSEE 0.15-0.03-0.01 MG TA: 0.15-0.03 & | 91 days supply | Qty: 91 | Fill #0

## 2017-09-14 ENCOUNTER — Encounter: Payer: Self-pay | Admitting: Orthopaedic Surgery

## 2017-09-14 ENCOUNTER — Ambulatory Visit (INDEPENDENT_AMBULATORY_CARE_PROVIDER_SITE_OTHER): Payer: 59 | Admitting: Orthopaedic Surgery

## 2017-09-14 VITALS — BP 124/84 | HR 83 | Temp 98.7°F | Ht 64.0 in | Wt 219.0 lb

## 2017-09-14 DIAGNOSIS — M79672 Pain in left foot: Secondary | ICD-10-CM | POA: Diagnosis not present

## 2017-09-14 DIAGNOSIS — G8929 Other chronic pain: Secondary | ICD-10-CM | POA: Diagnosis not present

## 2017-09-14 NOTE — Patient Instructions (Signed)
Steps to Quit Smoking Smoking tobacco can be bad for your health. It can also affect almost every organ in your body. Smoking puts you and people around you at risk for many serious long-lasting (chronic) diseases. Quitting smoking is hard, but it is one of the best things that you can do for your health. It is never too late to quit. What are the benefits of quitting smoking? When you quit smoking, you lower your risk for getting serious diseases and conditions. They can include:  Lung cancer or lung disease.  Heart disease.  Stroke.  Heart attack.  Not being able to have children (infertility).  Weak bones (osteoporosis) and broken bones (fractures).  If you have coughing, wheezing, and shortness of breath, those symptoms may get better when you quit. You may also get sick less often. If you are pregnant, quitting smoking can help to lower your chances of having a baby of low birth weight. What can I do to help me quit smoking? Talk with your doctor about what can help you quit smoking. Some things you can do (strategies) include:  Quitting smoking totally, instead of slowly cutting back how much you smoke over a period of time.  Going to in-person counseling. You are more likely to quit if you go to many counseling sessions.  Using resources and support systems, such as: ? Online chats with a counselor. ? Phone quitlines. ? Printed self-help materials. ? Support groups or group counseling. ? Text messaging programs. ? Mobile phone apps or applications.  Taking medicines. Some of these medicines may have nicotine in them. If you are pregnant or breastfeeding, do not take any medicines to quit smoking unless your doctor says it is okay. Talk with your doctor about counseling or other things that can help you.  Talk with your doctor about using more than one strategy at the same time, such as taking medicines while you are also going to in-person counseling. This can help make  quitting easier. What things can I do to make it easier to quit? Quitting smoking might feel very hard at first, but there is a lot that you can do to make it easier. Take these steps:  Talk to your family and friends. Ask them to support and encourage you.  Call phone quitlines, reach out to support groups, or work with a counselor.  Ask people who smoke to not smoke around you.  Avoid places that make you want (trigger) to smoke, such as: ? Bars. ? Parties. ? Smoke-break areas at work.  Spend time with people who do not smoke.  Lower the stress in your life. Stress can make you want to smoke. Try these things to help your stress: ? Getting regular exercise. ? Deep-breathing exercises. ? Yoga. ? Meditating. ? Doing a body scan. To do this, close your eyes, focus on one area of your body at a time from head to toe, and notice which parts of your body are tense. Try to relax the muscles in those areas.  Download or buy apps on your mobile phone or tablet that can help you stick to your quit plan. There are many free apps, such as QuitGuide from the CDC (Centers for Disease Control and Prevention). You can find more support from smokefree.gov and other websites.  This information is not intended to replace advice given to you by your health care provider. Make sure you discuss any questions you have with your health care provider. Document Released: 07/23/2009 Document   Revised: 05/24/2016 Document Reviewed: 02/10/2015 Elsevier Interactive Patient Education  2018 Elsevier Inc.  

## 2017-09-14 NOTE — Progress Notes (Signed)
Subjective:    Patient ID: Paula Burgess, female    DOB: 11/03/1985, 31 y.o.   MRN: [id number]  HPI She has left heel pain.  She has high arches.  She has had persistent heel pain on the left for some time now. 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  She has tried various shoes, various inserts, various treatments with no help.  She has been on diclofenac in the past.  She has tried ice, rubs, rest.  She still has pain.  She has pain more after sitting a while or at the beginning of each day.  She has no redness, no numbness, no trauma.  She smokes and knows she should not.  Review of Systems  Musculoskeletal: Positive for arthralgias.  All other systems reviewed and are negative.  Past Medical History:  Diagnosis Date  . GERD (gastroesophageal reflux disease)     Past Surgical History:  Procedure Laterality Date  . CESAREAN SECTION  2012  . CESAREAN SECTION N/A 02/12/2014   Procedure: Repeat CESAREAN SECTION;  Surgeon: Lovenia Kim, MD;  Location: [facility];  Service: Obstetrics;  Laterality: N/A;    Current Outpatient Medications on File Prior to Visit  Medication Sig Dispense Refill  . DAYSEE 0.15-0.03 &0.01 MG tablet Take 1 tablet by mouth daily.  4  . diclofenac (VOLTAREN) 75 MG EC tablet Take 1 tablet (75 mg total) by mouth 2 (two) times daily. Take with foood (Patient not taking: Reported on 09/14/2017) 28 tablet 0   No current facility-administered medications on file prior to visit.     Social History   Socioeconomic History  . Marital status: Married    Spouse name: Not on file  . Number of children: Not on file  . Years of education: Not on file  . Highest education level: Not on file  Social Needs  . Financial resource strain: Not on file  . Food insecurity - worry: Not on file  . Food insecurity - inability: Not on file  . Transportation needs - medical: Not on file  . Transportation needs - non-medical: Not on file    Occupational History  . Not on file  Tobacco Use  . Smoking status: Current Every Day Smoker    Types: Cigarettes  . Smokeless tobacco: Never Used  Substance and Sexual Activity  . Alcohol use: No  . Drug use: No  . Sexual activity: Not on file  Other Topics Concern  . Not on file  Social History Narrative  . Not on file    Family History  Problem Relation Age of Onset  . Healthy Mother   . Healthy Father   . Diabetes Maternal Grandmother   . Diabetes Maternal Grandfather   . Diabetes Paternal Grandmother   . Diabetes Paternal Grandfather     BP 124/84   Pulse 83   Temp 98.7 F (37.1 C)   Ht 5\' 4"  (1.626 m)   Wt 219 lb (99.3 kg)   BMI 37.59 kg/m      Objective:   Physical Exam  Constitutional: She is oriented to person, place, and time. She appears well-developed and well-nourished.  HENT:  Head: Normocephalic and atraumatic.  Eyes: Conjunctivae and EOM are normal. Pupils are equal, round, and reactive to light.  Neck: Normal range of motion. Neck supple.  Cardiovascular: Normal rate, regular rhythm and intact distal pulses.  Pulmonary/Chest: Effort normal.  Abdominal: Soft.  Musculoskeletal: She  exhibits tenderness (Pain left heel, diffuse with slightly more pain posteriorly, Achilles tight, NV intact, high arch left and right, ROM ankle full, stable.).  Neurological: She is alert and oriented to person, place, and time. She displays normal reflexes. No cranial nerve deficit. She exhibits normal muscle tone. Coordination normal.  Skin: Skin is warm and dry.  Psychiatric: She has a normal mood and affect. Her behavior is normal. Judgment and thought content normal.  Vitals reviewed.         Assessment & Plan:   Encounter Diagnosis  Name Primary?  . Chronic heel pain, left Yes   I have told her about stretching exercises and do them regularly.  I have advised her to begin Aleve one bid pc to two po bid pc.  Return in one month.  No X-rays done, I  can do if not improved.  I have told her this will take time to resolve. I cannot over emphasize the stretching.  She can use ice as needed.  Call if any problem.  Precautions discussed.   Electronically Signed Sanjuana Kava, MD 12/6/20189:51 AM

## 2017-10-17 ENCOUNTER — Ambulatory Visit: Payer: 59 | Admitting: Orthopaedic Surgery

## 2017-11-22 MED FILL — DAYSEE 0.15-0.03-0.01 MG TA: 0.15-0.03 & | 91 days supply | Qty: 91 | Fill #1

## 2018-02-20 MED FILL — DAYSEE 0.15-0.03-0.01 MG TA: 0.15-0.03 & | 91 days supply | Qty: 91 | Fill #2

## 2018-05-23 MED FILL — DAYSEE 0.15-0.03-0.01 MG TA: 0.15-0.03 & | 91 days supply | Qty: 91 | Fill #3

## 2018-09-05 MED FILL — DAYSEE 0.15-0.03-0.01 MG TA: 0.15-0.03 & | 91 days supply | Qty: 91 | Fill #0

## 2018-11-07 ENCOUNTER — Ambulatory Visit (INDEPENDENT_AMBULATORY_CARE_PROVIDER_SITE_OTHER): Payer: No Typology Code available for payment source | Admitting: Family Medicine

## 2018-11-07 ENCOUNTER — Encounter: Payer: Self-pay | Admitting: Family Medicine

## 2018-11-07 VITALS — BP 124/82 | Temp 98.6°F | Ht 65.0 in | Wt 243.2 lb

## 2018-11-07 DIAGNOSIS — I889 Nonspecific lymphadenitis, unspecified: Secondary | ICD-10-CM | POA: Diagnosis not present

## 2018-11-07 MED ORDER — AZITHROMYCIN 250 MG PO TABS: ORAL_TABLET | ORAL | 0 refills | Status: DC

## 2018-11-07 NOTE — Progress Notes (Signed)
   Subjective:    Patient ID: Paula Burgess, female    DOB: 06-Nov-1985, 33 y.o.   MRN: [id number]  Cough  This is a new problem. The current episode started in the past 7 days. Associated symptoms include nasal congestion and a sore throat. Associated symptoms comments: Sore neck. Treatments tried: thera flu, Aleve and tylenol.   Sunday neck starts hurting felt painful  Was quite sore   Woke up twic sweating    Results for orders placed or performed during the hospital encounter of 02/12/14  Urine culture  Result Value Ref Range   Specimen Description URINE, CLEAN CATCH    Special Requests NONE    Culture  Setup Time      05 /05/2014 15:09 Performed at [facility]      1,000 COLONIES/ML Performed at [facility]      INSIGNIFICANT GROWTH Performed at [facility]   Report Status 02/15/2014 FINAL   CBC  Result Value Ref Range   WBC 12.5 (H) 4.0 - 10.5 K/uL   RBC 2.75 (L) 3.87 - 5.11 MIL/uL   Hemoglobin 8.3 (L) 12.0 - 15.0 g/dL   HCT 24.9 (L) 36.0 - 46.0 %   MCV 90.5 78.0 - 100.0 fL   MCH 30.2 26.0 - 34.0 pg   MCHC 33.3 30.0 - 36.0 g/dL   RDW 14.5 11.5 - 15.5 %   Platelets 172 150 - 400 K/uL  Collect bld for placenta donatation  Result Value Ref Range   Placenta donation bld collect COLLECTED BY LABORATORY   Urinalysis, Routine w reflex microscopic  Result Value Ref Range   Color, Urine YELLOW YELLOW   APPearance CLEAR CLEAR   Specific Gravity, Urine >1.030 (H) 1.005 - 1.030   pH 6.0 5.0 - 8.0   Glucose, UA NEGATIVE NEGATIVE mg/dL   Hgb urine dipstick LARGE (A) NEGATIVE   Bilirubin Urine NEGATIVE NEGATIVE   Ketones, ur NEGATIVE NEGATIVE mg/dL   Protein, ur NEGATIVE NEGATIVE mg/dL   Urobilinogen, UA 0.2 0.0 - 1.0 mg/dL   Nitrite NEGATIVE NEGATIVE   Leukocytes, UA NEGATIVE NEGATIVE  Urine microscopic-add on  Result Value Ref Range   Squamous Epithelial / LPF RARE RARE   WBC, UA 0-2 <3 WBC/hpf   RBC / HPF  11-20 <3 RBC/hpf   Took tyle nd leave  Temp max was 100.0  milf sore throat   No eadache  Nettie pot used thriee times  Mild dtanage  No any substantial  Energy level   Appetite  No one else sick at home     Review of Systems  HENT: Positive for sore throat.   Respiratory: Positive for cough.        Objective:   Physical Exam  Alert active good hydration mild nasal congestion neck supple.  Some pain with lateral movement.  Pharynx erythematous tender anterior glands lungs clear.  Impression potential flu with now secondary pharyngitis/cervical lymphadenitis antibiotics prescribed symptom care discussed warning signs discussed      Assessment & Plan:

## 2019-01-31 MED FILL — DAYSEE 0.15-0.03-0.01 MG TA: 0.15-0.03 & | 91 days supply | Qty: 91 | Fill #0

## 2019-01-31 MED FILL — PHENTERMINE 37.5 MG TABLET: 37.5 | 30 days supply | Qty: 30 | Fill #0

## 2019-03-15 MED FILL — PHENTERMINE 37.5 MG TABLET: 37.5 | 30 days supply | Qty: 30 | Fill #1

## 2019-04-18 MED FILL — PHENTERMINE 37.5 MG TABLET: 37.5 | 30 days supply | Qty: 30 | Fill #2

## 2019-05-23 MED FILL — PHENTERMINE 37.5 MG TABLET: 37.5 | 30 days supply | Qty: 30 | Fill #0

## 2019-07-03 MED FILL — PHENTERMINE 37.5 MG TABLET: 37.5 | 30 days supply | Qty: 30 | Fill #1

## 2019-07-03 MED FILL — ASHLYNA 0.15-0.03-0.01 MG T: 0.15-0.03 & | 91 days supply | Qty: 91 | Fill #1

## 2019-07-09 ENCOUNTER — Ambulatory Visit (INDEPENDENT_AMBULATORY_CARE_PROVIDER_SITE_OTHER): Payer: No Typology Code available for payment source | Admitting: Family Medicine

## 2019-07-09 ENCOUNTER — Other Ambulatory Visit: Payer: Self-pay

## 2019-07-09 VITALS — BP 130/78 | Temp 97.3°F | Wt 217.0 lb

## 2019-07-09 DIAGNOSIS — H9311 Tinnitus, right ear: Secondary | ICD-10-CM

## 2019-07-09 NOTE — Progress Notes (Signed)
   Subjective:    Patient ID: Paula Burgess, female    DOB: [birth year], 33 y.o.   MRN: [id number]  HPI  Patient arrives with right ear pain and ringing in he rear since February. Patient states it is starting to give he headaches.   Right ear discomfort since  Feb  Really bad headaches with it   Ice pick feeling in the ear   Ears have been fine down thru te years    some hx of moking, recently started back  Day [phone]  Has lost thirty pounds  Using phentermine    Review of Systems No headache, no major weight loss or weight gain, no chest pain no back pain abdominal pain no change in bowel habits complete ROS otherwise negative     Objective:   Physical Exam Alert vitals stable, NAD. Blood pressure good on repeat. HEENT external canal and TM completely normal. Lungs clear. Heart regular rate and rhythm. Neck no bruits heard strong carotid pulse no adenopathy      Assessment & Plan:  Impression tinnitus with neuropathic-like pain.  At times tended to study other times seems to be more pulsatile sharp shooting pains in similar area.  ENT referral for further work-up

## 2019-08-06 MED FILL — PHENTERMINE 37.5 MG TABLET: 37.5 | 30 days supply | Qty: 30 | Fill #2

## 2019-09-02 ENCOUNTER — Ambulatory Visit (INDEPENDENT_AMBULATORY_CARE_PROVIDER_SITE_OTHER): Payer: No Typology Code available for payment source | Admitting: Otolaryngology

## 2019-09-02 ENCOUNTER — Other Ambulatory Visit: Payer: Self-pay

## 2019-09-02 DIAGNOSIS — H9311 Tinnitus, right ear: Secondary | ICD-10-CM | POA: Diagnosis not present

## 2019-09-13 ENCOUNTER — Other Ambulatory Visit: Payer: Self-pay | Admitting: Otolaryngology

## 2019-09-13 ENCOUNTER — Other Ambulatory Visit (HOSPITAL_COMMUNITY): Payer: Self-pay | Admitting: Otolaryngology

## 2019-09-13 DIAGNOSIS — H9311 Tinnitus, right ear: Secondary | ICD-10-CM

## 2019-09-16 MED FILL — PHENTERMINE 37.5 MG TABLET: 37.5 | 30 days supply | Qty: 30 | Fill #0

## 2019-09-23 ENCOUNTER — Ambulatory Visit (HOSPITAL_COMMUNITY)
Admission: RE | Admit: 2019-09-23 | Discharge: 2019-09-23 | Disposition: A | Discharge status: Home / Self Care | Payer: No Typology Code available for payment source | Source: Ambulatory Visit | Attending: Otolaryngology | Admitting: Otolaryngology

## 2019-09-23 ENCOUNTER — Other Ambulatory Visit: Payer: Self-pay

## 2019-09-23 DIAGNOSIS — H9311 Tinnitus, right ear: Secondary | ICD-10-CM | POA: Insufficient documentation

## 2019-09-23 IMAGING — MR MR HEAD WO/W CM
6 of 12 series · 23 of 48 positions shown · IV contrast (gadavist)
Comparison: No pertinent prior studies available for comparison.

CLINICAL DATA: Right-sided tinnitus. Additional history provided:
Tinnitus, headache for 10 months.

EXAM:
MRI HEAD WITHOUT AND WITH CONTRAST
TECHNIQUE: Multiplanar, multiecho pulse sequences of the brain and surrounding
structures were obtained without and with intravenous contrast.
CONTRAST:  10mL GADAVIST GADOBUTROL 1 MMOL/ML IV SOLN

[Series 3: DWI · axial · 3.0mm · 0.76mm/px · z∈[-30,+132]mm · 7 of 55 slices shown]
[im 1/55]
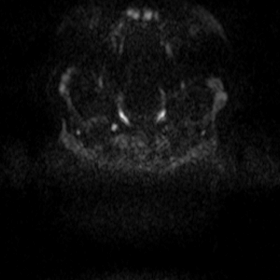
[im 10/55]
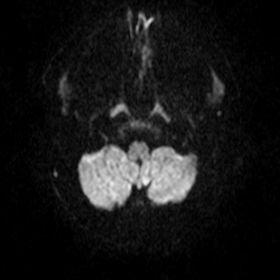
[im 19/55]
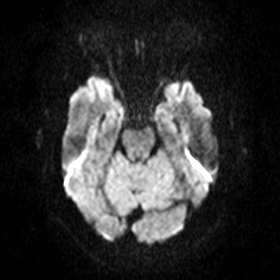
[im 28/55]
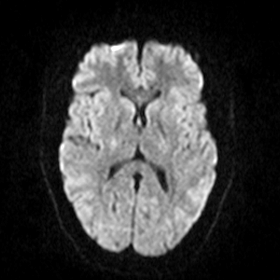
[im 37/55]
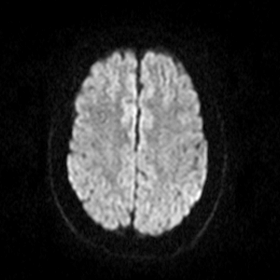
[im 46/55]
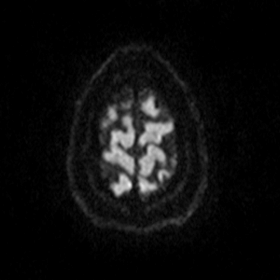
[im 55/55]
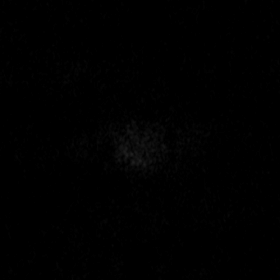

[Series 5: T2 · axial · 5.0mm · 0.43mm/px · 1 of 23 slices shown]
[im 1/23]
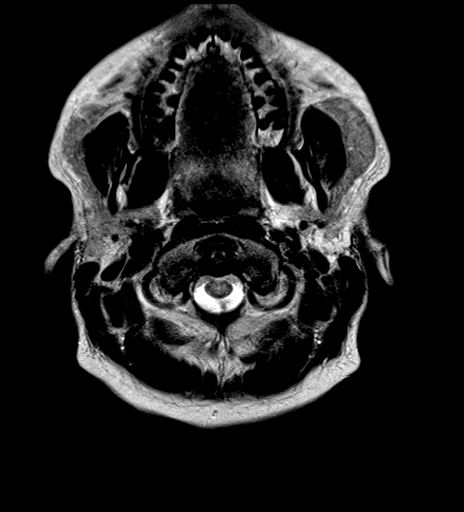

[Series 6: FLAIR · axial · 3.0mm · 0.36mm/px · z∈[-29,+109]mm · 5 of 47 slices shown]
[im 1/47]
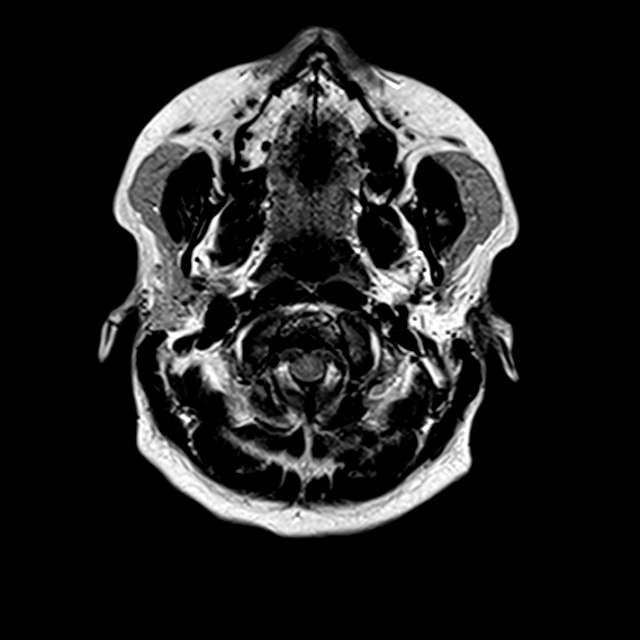
[im 12/47]
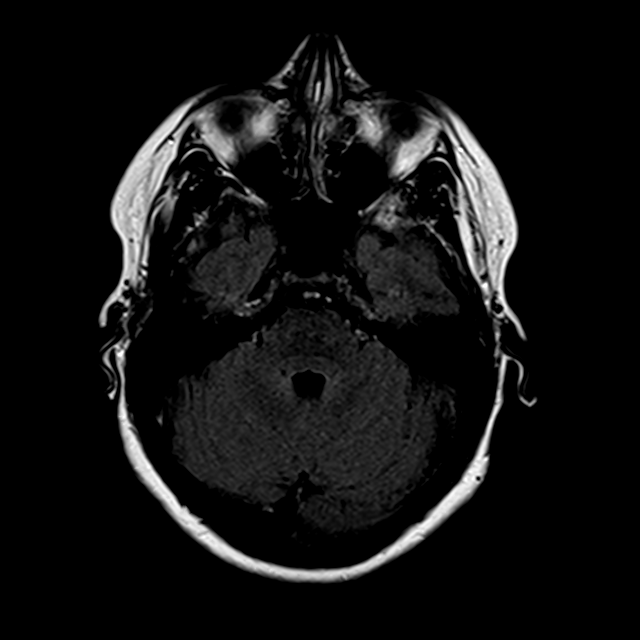
[im 24/47]
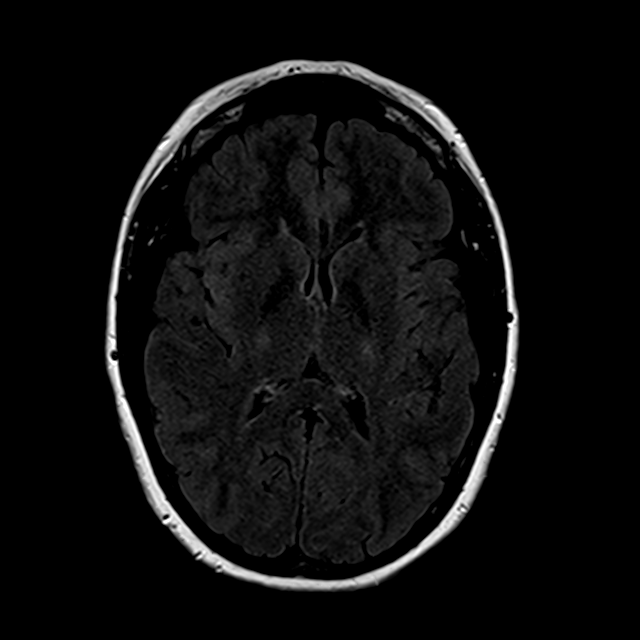
[im 35/47]
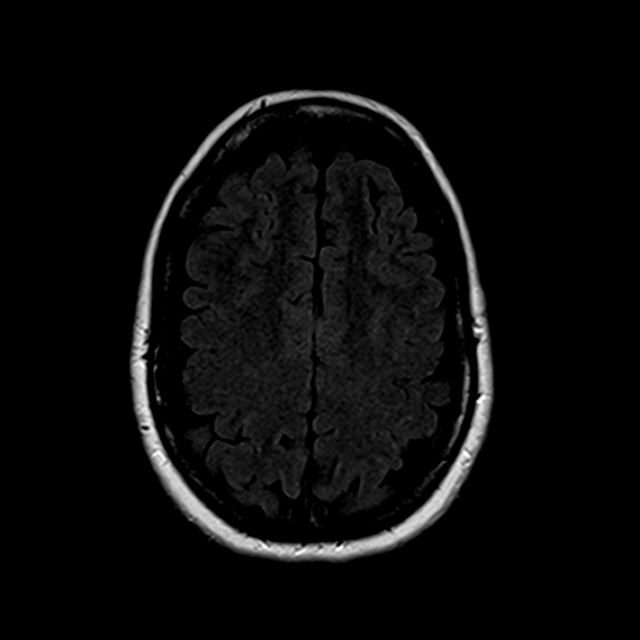
[im 47/47]
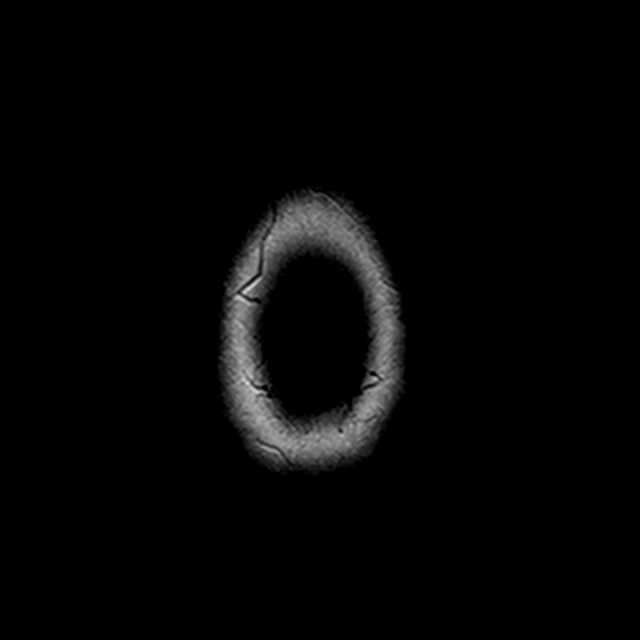

[Series 15: T1 post-contrast · coronal · 3.0mm · 0.35mm/px · 1 of 13 slices shown (1 of 3)]
[im 1/13]
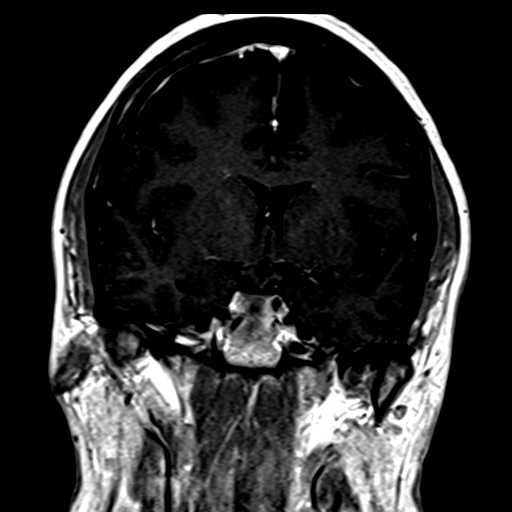

[Series 16: T1 post-contrast · axial · 3.0mm · 0.37mm/px · 1 of 13 slices shown (2 of 3)]
[im 1/13]
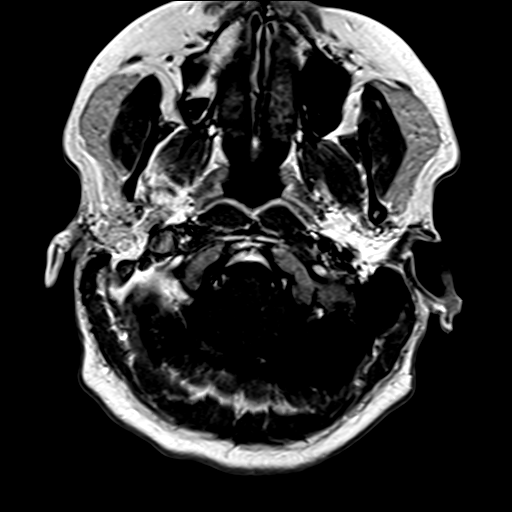

[Series 17: T1 post-contrast · axial · 2.0mm · 0.42mm/px · z∈[-39,+117]mm · 8 of 79 slices shown (3 of 3)]
[im 1/79]
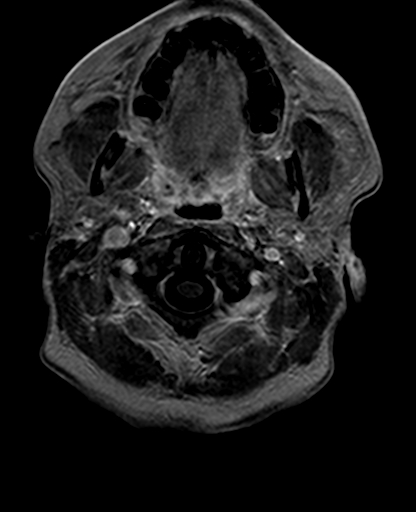
[im 10/79]
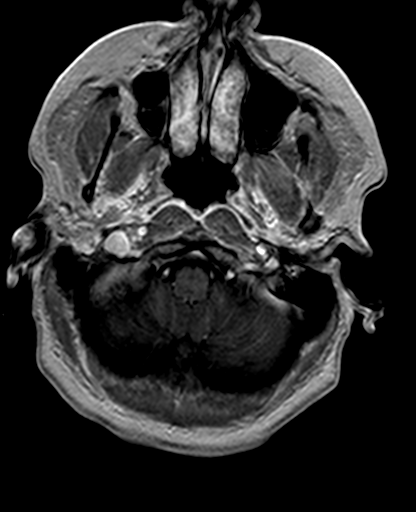
[im 20/79]
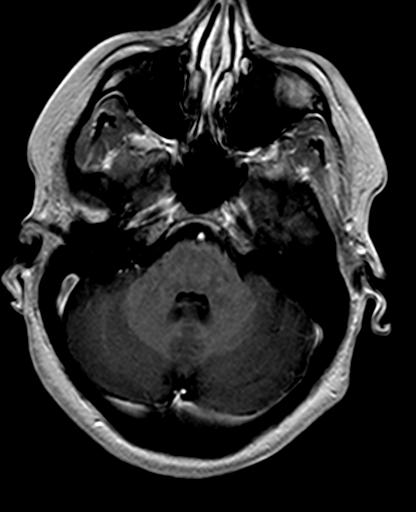
[im 30/79]
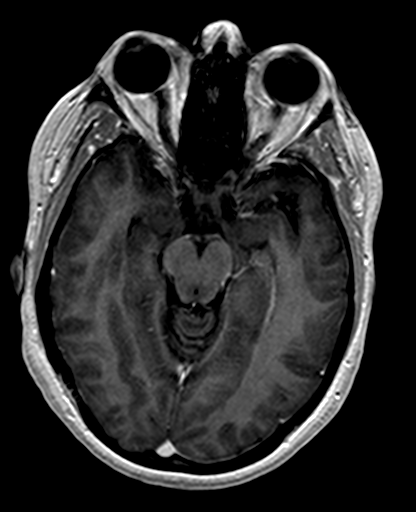
[im 49/79]
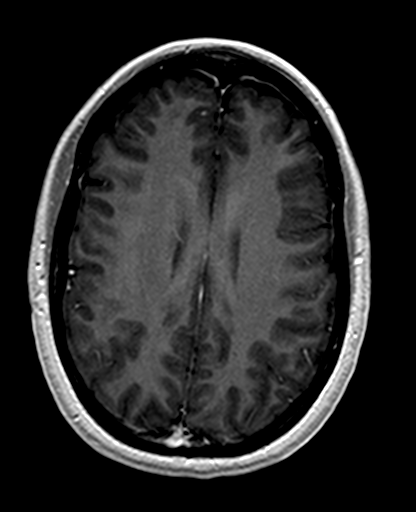
[im 59/79]
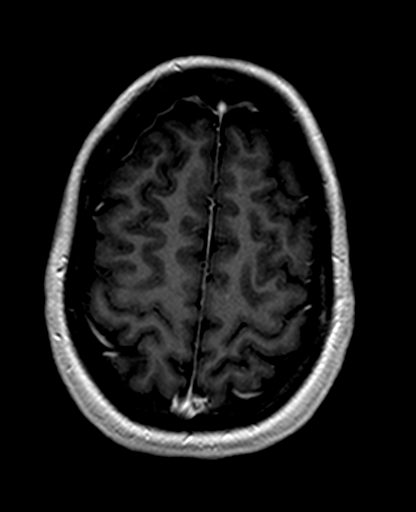
[im 69/79]
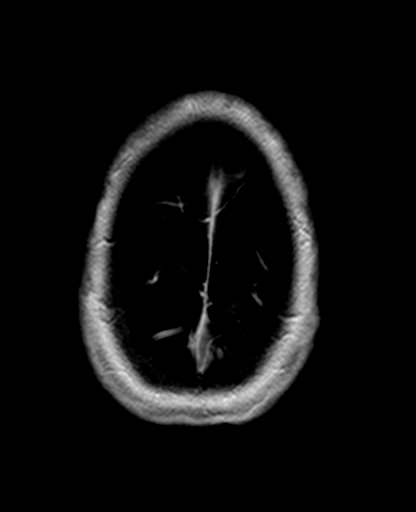
[im 79/79]
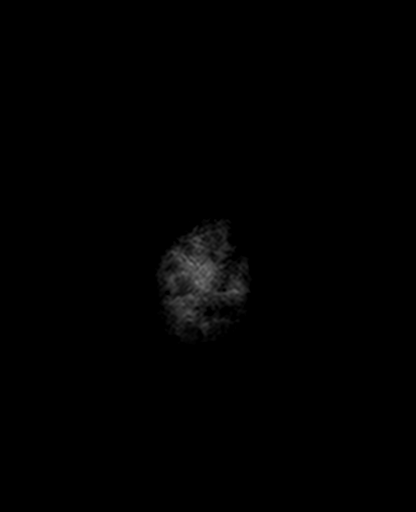

[23 of 48 positions shown; findings below may reference images not displayed]

FINDINGS: Brain:

Multiple sequences are mildly motion degraded.

There is no evidence of acute infarct.

No evidence of intracranial mass.

No midline shift or extra-axial fluid collection.

No chronic intracranial blood products.

No focal parenchymal signal abnormality is identified.

No cerebellopontine angle mass or internal auditory canal lesion is
demonstrated. Normal appearance of the seventh and eighth cranial
nerves bilaterally.

No abnormal intracranial enhancement.

Cerebral volume is normal for age. Partially empty sella turcica.

Vascular: Flow voids maintained within the proximal large arterial
vessels. Expected enhancement within the proximal large arterial
vessels and dural venous sinuses.

Skull and upper cervical spine: No focal marrow lesion

Sinuses/Orbits: Visualized orbits demonstrate no acute abnormality.
Minimal scattered paranasal sinus mucosal thickening. No significant
mastoid effusion.
IMPRESSION: No cerebellopontine angle mass or internal auditory canal lesion
demonstrated.

No evidence of acute intracranial abnormality.

Partially empty sella turcica. This is very commonly an incidental
finding, but may be seen in the setting of idiopathic intracranial
hypertension.

Otherwise normal MRI appearance of the brain for age.

## 2019-09-23 MED ORDER — GADOBUTROL 1 MMOL/ML IV SOLN
10.0000 mL | Freq: Once | INTRAVENOUS | Status: AC | PRN
  Administered 2019-09-23: 15:00:00 10 mL via INTRAVENOUS

## 2019-09-26 ENCOUNTER — Other Ambulatory Visit: Payer: Self-pay | Admitting: Otolaryngology

## 2019-09-26 ENCOUNTER — Other Ambulatory Visit (HOSPITAL_COMMUNITY): Payer: Self-pay | Admitting: Otolaryngology

## 2019-09-26 DIAGNOSIS — H9311 Tinnitus, right ear: Secondary | ICD-10-CM

## 2019-10-16 MED FILL — PHENTERMINE 37.5 MG TABLET: 37.5 | 30 days supply | Qty: 30 | Fill #1

## 2019-10-16 MED FILL — ASHLYNA 0.15-0.03-0.01 MG T: 0.15-0.03 & | 91 days supply | Qty: 91 | Fill #2

## 2019-10-21 ENCOUNTER — Ambulatory Visit (HOSPITAL_COMMUNITY)
Admission: RE | Admit: 2019-10-21 | Discharge: 2019-10-21 | Disposition: A | Discharge status: Home / Self Care | Payer: No Typology Code available for payment source | Source: Ambulatory Visit | Attending: Otolaryngology | Admitting: Otolaryngology

## 2019-10-21 ENCOUNTER — Other Ambulatory Visit: Payer: Self-pay

## 2019-10-21 DIAGNOSIS — H9311 Tinnitus, right ear: Secondary | ICD-10-CM | POA: Diagnosis present

## 2019-10-21 IMAGING — CT CT ANGIO HEAD
3 of 11 series · 18 of 47 positions shown · IV contrast (omnipaque)
Comparison: Head MRI [DATE]

CLINICAL DATA: Right-sided tinnitus and headache for 11 months.

EXAM:
CT ANGIOGRAPHY HEAD
TECHNIQUE: Multidetector CT imaging of the head was performed using the
standard protocol during bolus administration of intravenous
contrast. Multiplanar CT image reconstructions and MIPs were
obtained to evaluate the vascular anatomy.
CONTRAST:  75mL OMNIPAQUE IOHEXOL 350 MG/ML SOLN

[Series 8: ax thin · axial · 0.40mm/px · z∈[+3,+145]mm · 13 of 169 slices shown]
[im 13/169  brain]
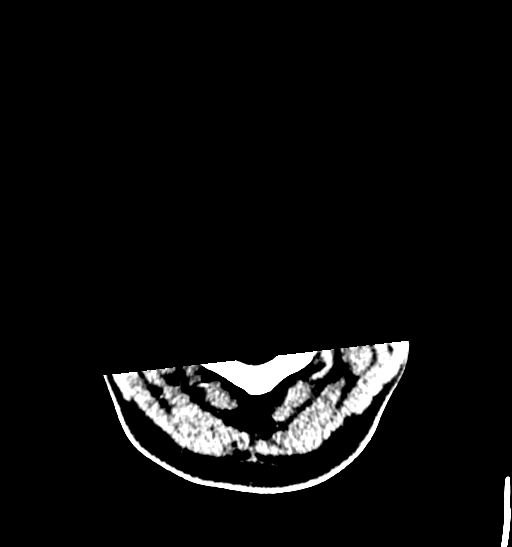
[im 25/169  bone]
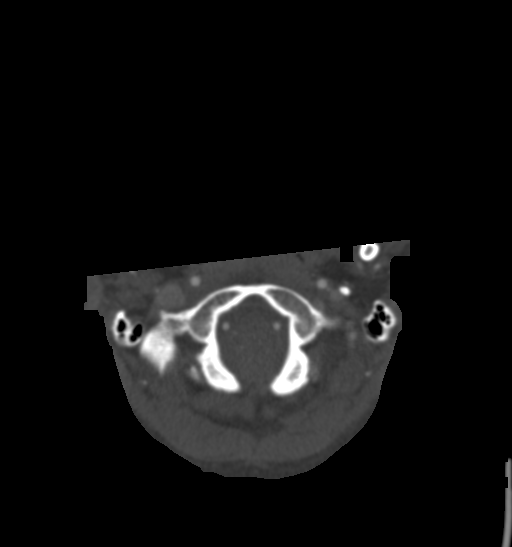
[im 37/169  brain]
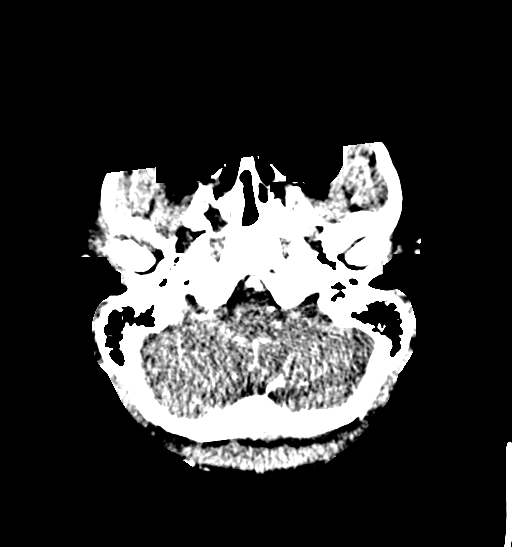
[im 49/169  bone]
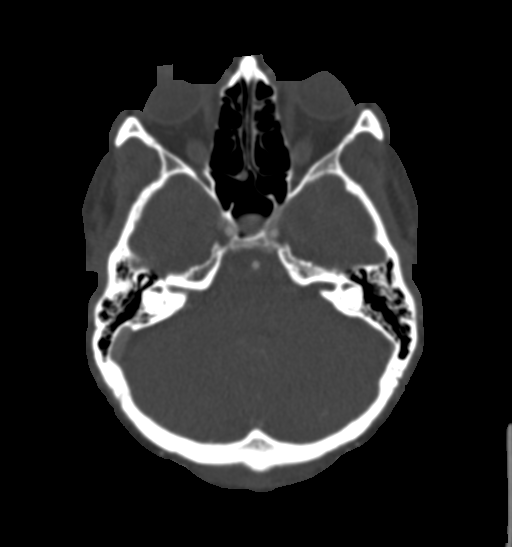
[im 61/169  brain]
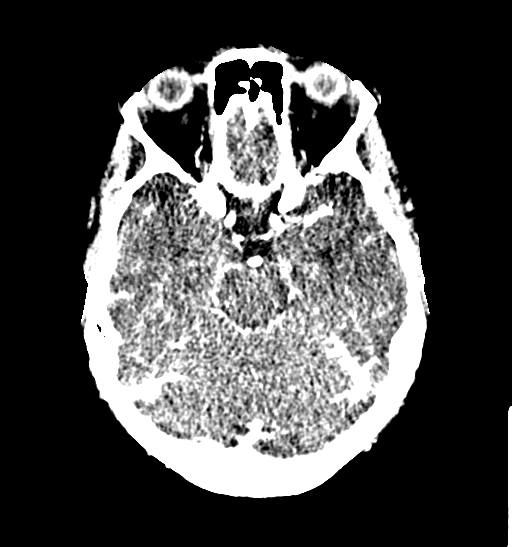
[im 73/169  bone]
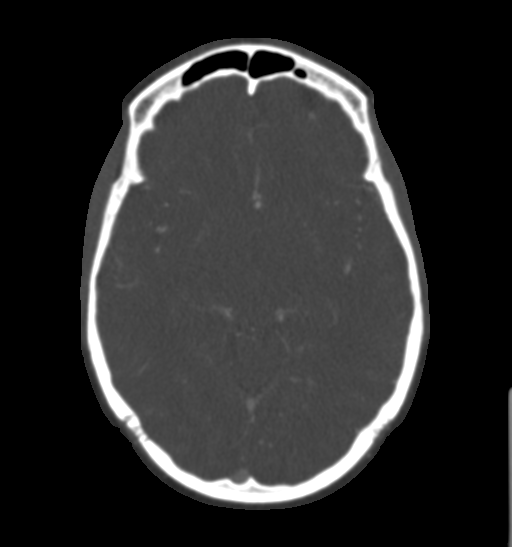
[im 85/169  brain]
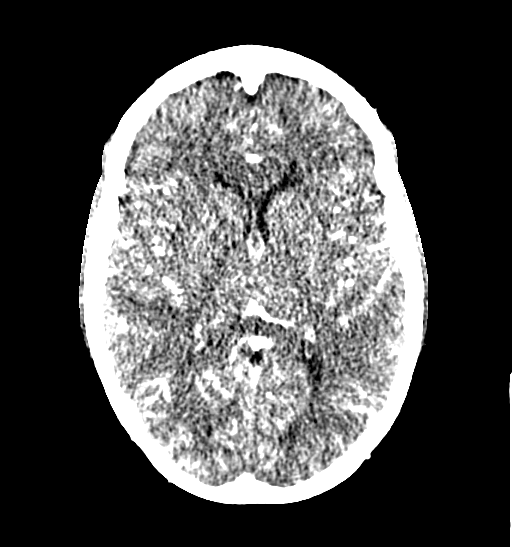
[im 97/169  bone]
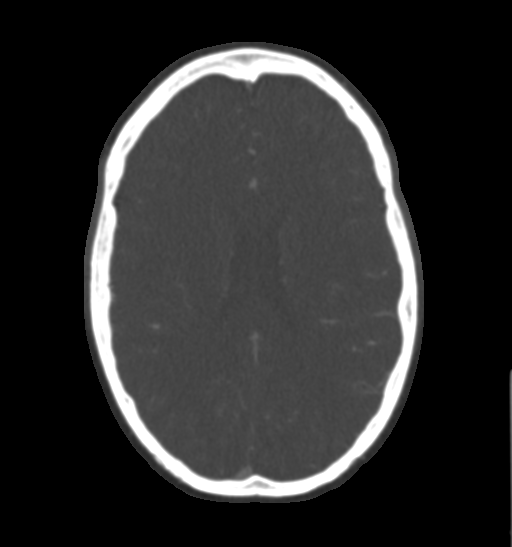
[im 109/169  brain]
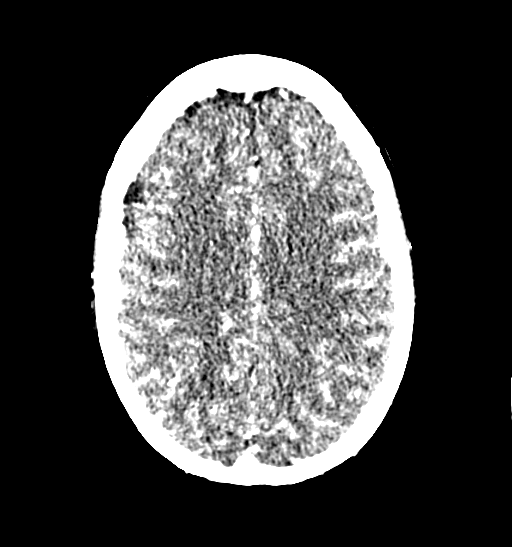
[im 121/169  bone]
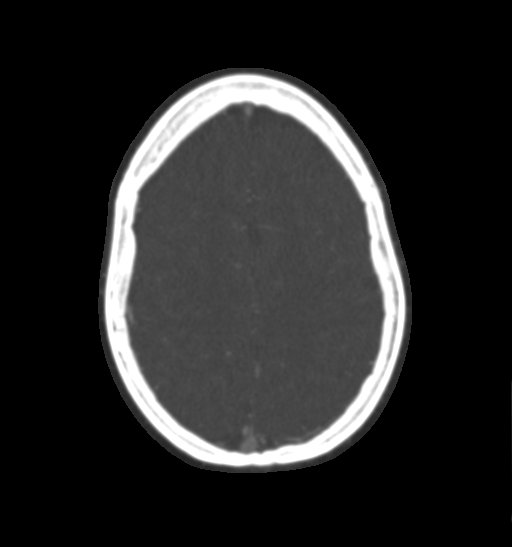
[im 133/169  brain]
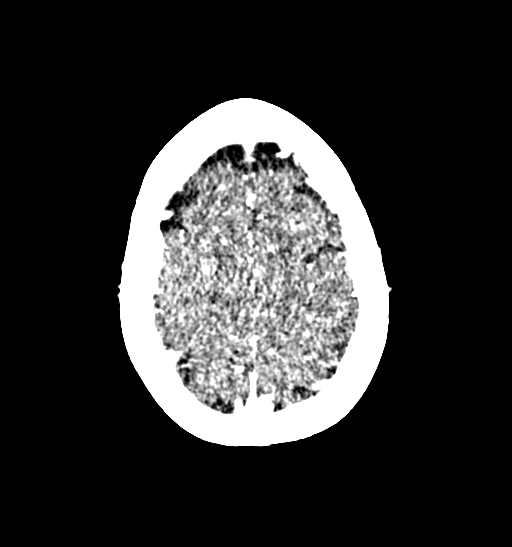
[im 145/169  bone]
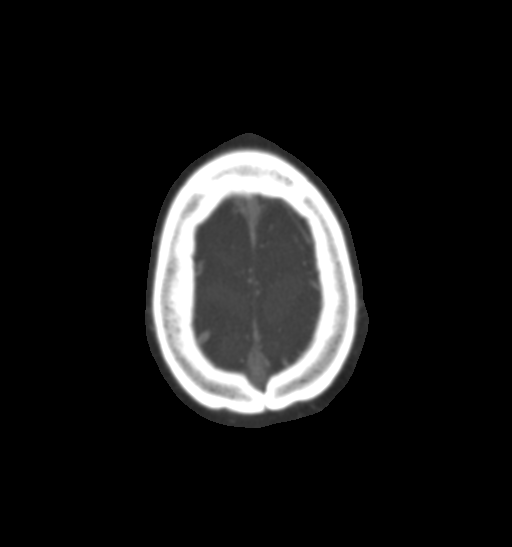
[im 157/169  brain]
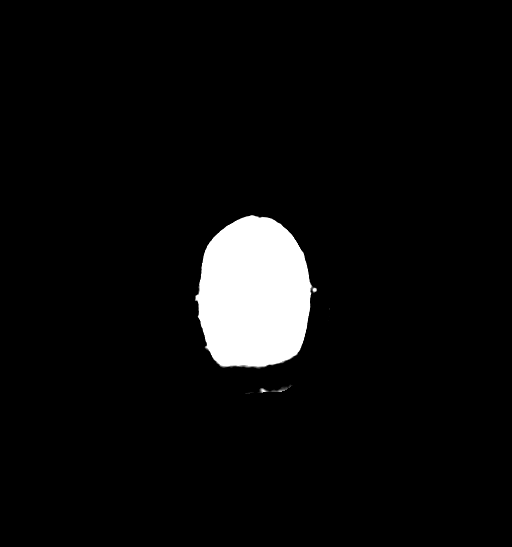

[Series 10: cor thin · coronal · 0.36mm/px · 3 of 203 slices shown]
[im 68/203  brain]
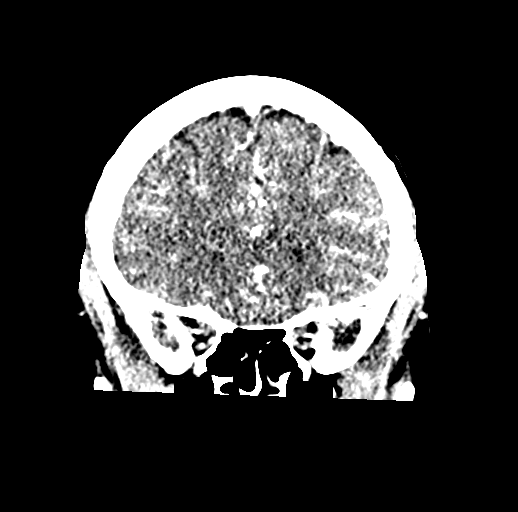
[im 102/203  brain]
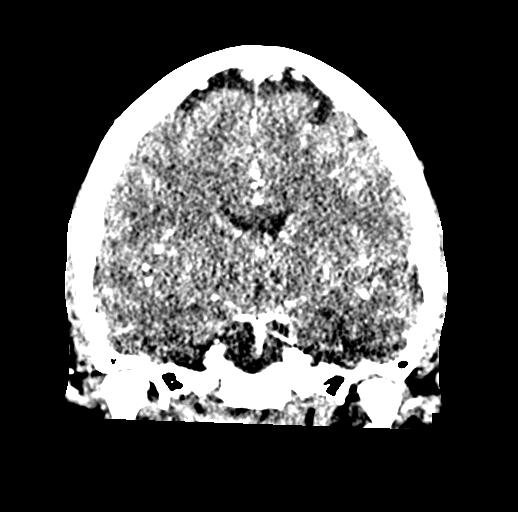
[im 135/203  brain]
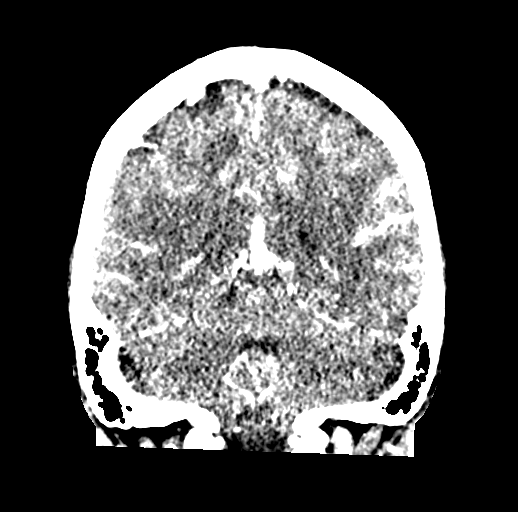

[Series 12: sag thin · sagittal · 0.35mm/px · 2 of 170 slices shown]
[im 57/170  brain]
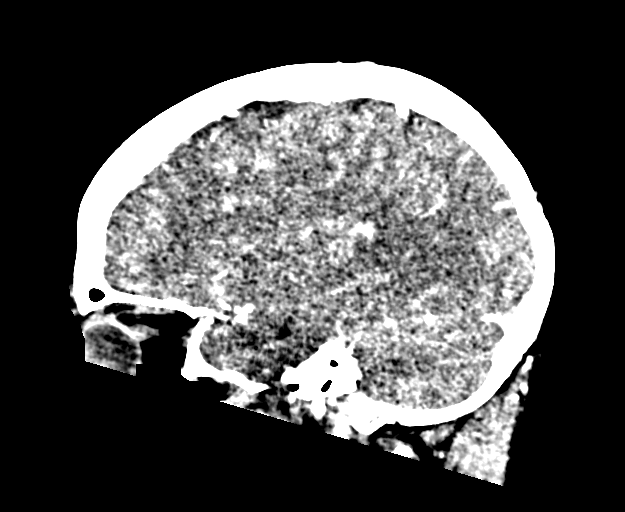
[im 113/170  brain]
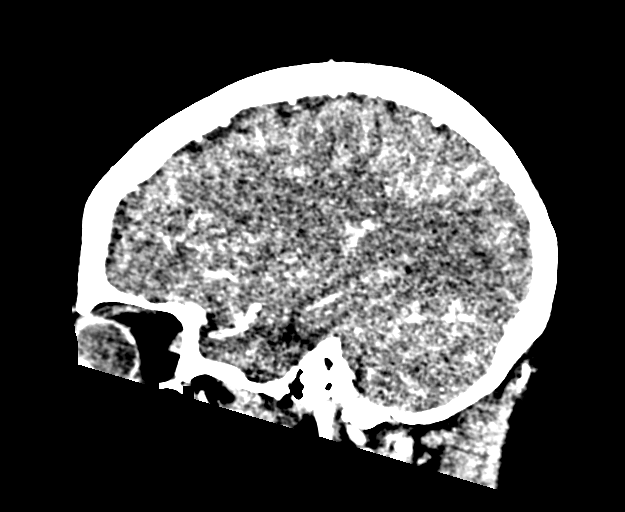

[18 of 47 positions shown; findings below may reference images not displayed]

FINDINGS: CT HEAD

Brain: There is no evidence of acute infarct, intracranial
hemorrhage, mass, midline shift, or extra-axial fluid collection.
The ventricles and sulci are normal. A partially empty sella is
again noted.

Vascular: As below.

Skull: No fracture or focal osseous lesion.

Sinuses: Visualized paranasal sinuses and mastoid air cells are
clear.

Orbits: Visualized portions of the orbits are unremarkable.

CTA HEAD

Anterior circulation: The internal carotid arteries are widely
patent from skull base to carotid termini. ACAs and MCAs are patent
without evidence of proximal branch occlusion or significant
proximal stenosis. No aneurysm or vascular malformation is
identified.

Posterior circulation: The visualized distal vertebral arteries are
widely patent to the basilar and codominant. Patent PICA, AICA, and
SCA origins are seen bilaterally. The basilar artery is widely
patent. There are small posterior communicating arteries
bilaterally. The PCAs are patent without evidence of significant
proximal stenosis. No aneurysm or vascular malformation is
identified.

Venous sinuses: As permitted by contrast timing, patent. Hypoplastic
left transverse and sigmoid sinuses. No evidence of dural AV
fistula.

Anatomic variants: None.
IMPRESSION: 1. Negative head CTA.
2. Partially empty sella.

## 2019-10-21 MED ORDER — IOHEXOL 350 MG/ML SOLN
100.0000 mL | Freq: Once | INTRAVENOUS | Status: AC | PRN
  Administered 2019-10-21: 75 mL via INTRAVENOUS

## 2019-11-11 ENCOUNTER — Encounter: Payer: Self-pay | Admitting: Family Medicine

## 2019-12-10 ENCOUNTER — Other Ambulatory Visit: Payer: Self-pay

## 2019-12-10 ENCOUNTER — Encounter: Payer: Self-pay | Admitting: Neurology

## 2019-12-10 ENCOUNTER — Ambulatory Visit: Payer: No Typology Code available for payment source | Admitting: Neurology

## 2019-12-10 VITALS — BP 131/86 | HR 101 | Temp 97.1°F | Ht 65.0 in | Wt 210.0 lb

## 2019-12-10 DIAGNOSIS — G932 Benign intracranial hypertension: Secondary | ICD-10-CM | POA: Insufficient documentation

## 2019-12-10 DIAGNOSIS — H4710 Unspecified papilledema: Secondary | ICD-10-CM | POA: Insufficient documentation

## 2019-12-10 MED ORDER — ACETAZOLAMIDE 250 MG PO TABS: 250.0000 mg | ORAL_TABLET | Freq: Two times a day (BID) | ORAL | 6 refills | Status: DC

## 2019-12-10 MED FILL — acetaZOLAMIDE 250 MG TABS: 250 | 30 days supply | Qty: 60 | Fill #0

## 2019-12-10 NOTE — Patient Instructions (Signed)
Lumbar Puncture After LP can start Diamox [facility] RTC 8-10 weeks   Idiopathic Intracranial Hypertension  Idiopathic intracranial hypertension (IIH) is a condition that increases pressure around the brain. The fluid that surrounds the brain and spinal cord (cerebrospinal fluid, CSF) increases and causes the pressure. Idiopathic means that the cause of this condition is not known. IIH affects the brain and spinal cord (is a neurological disorder). If this condition is not treated, it can cause vision loss or blindness. What increases the risk? You are more likely to develop this condition if:  You are severely overweight (obese).  You are a woman who has not gone through menopause.  You take certain medicines, such as birth control or steroids. What are the signs or symptoms? Symptoms of IIH include:  Headaches. This is the most common symptom.  Pain in the shoulders or neck.  Nausea and vomiting.  A "rushing water" or pulsing sound within the ears (pulsatile tinnitus).  Double vision.  Blurred vision.  Brief episodes of complete vision loss. How is this diagnosed? This condition may be diagnosed based on:  Your symptoms.  Your medical history.  CT scan of the brain.  MRI of the brain.  Magnetic resonance venogram (MRV) to check veins in the brain.  Diagnostic lumbar puncture. This is a procedure to remove and examine a sample of cerebrospinal fluid. This procedure can determine whether too much fluid may be causing IIH.  A thorough eye exam to check for swelling or nerve damage in the eyes. How is this treated? Treatment for this condition depends on your symptoms. The goal of treatment is to decrease the pressure around your brain. Common treatments include:  Medicines to decrease the production of spinal fluid and lower the pressure within your skull.  Medicines to prevent or treat headaches.  Surgery to place drains (shunts) in your brain  to remove excess fluid.  Lumbar puncture to remove excess cerebrospinal fluid. Follow these instructions at home:  If you are overweight or obese, work with your health care provider to lose weight.  Take over-the-counter and prescription medicines only as told by your health care provider.  Do not drive or use heavy machinery while taking medicines that can make you sleepy.  Keep all follow-up visits as told by your health care provider. This is important. Contact a health care provider if:  You have changes in your vision, such as: ? Double vision. ? Not being able to see colors (color vision). Get help right away if:  You have any of the following symptoms and they get worse or do not get better. ? Headaches. ? Nausea. ? Vomiting. ? Vision changes or difficulty seeing. Summary  Idiopathic intracranial hypertension (IIH) is a condition that increases pressure around the brain. The cause is not known (is idiopathic).  The most common symptom of IIH is headaches.  Treatment may include medicines or surgery to relieve the pressure on your brain. This information is not intended to replace advice given to you by your health care provider. Make sure you discuss any questions you have with your health care provider. Document Revised: 09/08/2017 Document Reviewed: 08/17/2016 Elsevier Patient Education  [place]. Acetazolamide Oral Tablets What is this medicine? ACETAZOLAMIDE (a set a ZOLE a mide) is a diuretic. It helps you make more urine and to lose salt and excess water from your body. It treats swelling from heart disease. It helps treat some seizures and some kinds of glaucoma. It also  treats and prevents symptoms of altitude sickness (acute mountain sickness). This medicine may be used for other purposes; ask your health care provider or pharmacist if you have questions. COMMON BRAND NAME(S): Diamox What should I tell my health care provider before I take this  medicine? They need to know if you have any of these conditions:  glaucoma  kidney disease  liver disease  low adrenal gland function  lung or breathing disease (COPD, chronic bronchitis, emphysema)  an unusual or allergic reaction to acetazolamide, sulfa drugs, other drugs, foods, dyes or preservatives  pregnant or trying to get pregnant  breast-feeding How should I use this medicine? Take this medicine by mouth with a glass of water. Follow the directions on the prescription label. Take this medicine with food if it upsets your stomach. Take your doses at regular intervals. Do not take your medicine more often than directed. Do not stop taking except on your doctor's advice. Talk to your pediatrician regarding the use of this medicine in children. Special care may be needed. Patients over 72 years old may have a stronger reaction and need a smaller dose. Overdosage: If you think you have taken too much of this medicine contact a poison control center or emergency room at once. NOTE: This medicine is only for you. Do not share this medicine with others. What if I miss a dose? If you miss a dose, take it as soon as you can. If it is almost time for your next dose, take only that dose. Do not take double or extra doses. What may interact with this medicine? Do not take this medicine with any of the following medications:  methazolamide This medicine may also interact with the following medications:  aspirin and aspirin-like medicines  cyclosporine  lithium  medicine for diabetes  methenamine  other diuretics  phenytoin  primidone  quinidine  sodium bicarbonate  stimulant medicines like dextroamphetamine This list may not describe all possible interactions. Give your health care provider a list of all the medicines, herbs, non-prescription drugs, or dietary supplements you use. Also tell them if you smoke, drink alcohol, or use illegal drugs. Some items may interact  with your medicine. What should I watch for while using this medicine? Visit your doctor or health care professional for regular checks on your progress. You will need blood work done regularly. If you are diabetic, check your blood sugar as directed. You may need to be on a special diet while taking this medicine. Ask your doctor. Also, ask how many glasses of fluid you need to drink a day. You must not get dehydrated. You may get drowsy or dizzy. Do not drive, use machinery, or do anything that needs mental alertness until you know how this medicine affects you. Do not stand or sit up quickly, especially if you are an older patient. This reduces the risk of dizzy or fainting spells. This medicine can make you more sensitive to the sun. Keep out of the sun. If you cannot avoid being in the sun, wear protective clothing and use sunscreen. Do not use sun lamps or tanning beds/booths. What side effects may I notice from receiving this medicine? Side effects that you should report to your doctor or health care professional as soon as possible:  allergic reactions like skin rash, itching or hives, swelling of the face, lips, or tongue  breathing problems  confusion, depression  dark urine  fever  numbness, tingling in hands or feet  redness, blistering, peeling or  loosening of the skin, including inside the mouth  ringing in the ears  seizures  unusually weak or tired  yellowing of the eyes or skin Side effects that usually do not require medical attention (report to your doctor or health care professional if they continue or are bothersome):  change in taste  diarrhea  headache  loss of appetite  nausea, vomiting  passing urine more often This list may not describe all possible side effects. Call your doctor for medical advice about side effects. You may report side effects to FDA at 1-800-FDA-1088. Where should I keep my medicine? Keep out of the reach of children. Store  at room temperature between 20 and 25 degrees C (68 and 77 degrees F). Throw away any unused medicine after the expiration date. NOTE: This sheet is a summary. It may not cover all possible information. If you have questions about this medicine, talk to your doctor, pharmacist, or health care provider.  2020 Elsevier/Gold Standard (2019-07-23 12:32:38)

## 2019-12-10 NOTE — Progress Notes (Signed)
[id number]    Provider:  Dr Jaynee Eagles Requesting Provider: Mikey Kirschner, MD Primary Care Provider:  Mikey Kirschner, MD  CC:  headache  HPI:  Paula Burgess is a 34 y.o. female here as requested by Mikey Kirschner, MD for optic nerve edema. Tinniitus started about a year ago.Went to ENT who ordered MRI of the brain and cat scan. She has headaches, she has headaches, she wakes up with headaches, can be severe. Mostly dull. But severe ones can be ice-pick. Headache son the right side behind the right eye. Mostly dull. Daily but moderate in pain. She says the ringing in the ears is more swishing. Worse when up and moving around. She has blurry vison but not all the time. Stress may make things worse. She went to see the eye doctor and has new glasses which are not helping. Nothing makes it better. Continues moderate headache with severe exacerbations, blurry vision, pulsating in the ears. When the symptoms started she had gained 60 lbs. Losing 40 lbs has helped. She has chronic neck pain. No IUD, no long-term antibiotics or vitamin A. No other focal neurologic deficits, associated symptoms, inciting events or modifiable factors.  Reviewed notes, labs and imaging from outside physicians, which showed :  Reviewed CTA of the head: 1. Negative head CTA. 2. Partially empty sella.  Partially empty sella turcica. This is very commonly an incidental finding, but may be seen in the setting of idiopathic intracranial Hypertension. Personally reviewed MRi brain.  Review of Systems: Patient complains of symptoms per HPI as well as the following symptoms: headache. Pertinent negatives and positives per HPI. All others negative.   Social History   Socioeconomic History  . Marital status: Divorced    Spouse name: Not on file  . Number of children: 2  . Years of education: Not on file  . Highest education level: Associate degree: occupational, Hotel manager, or vocational  program  Occupational History  . Not on file  Tobacco Use  . Smoking status: Current Every Day Smoker    Packs/day: 0.50    Types: Cigarettes  . Smokeless tobacco: Never Used  Substance and Sexual Activity  . Alcohol use: No  . Drug use: No  . Sexual activity: Not on file  Other Topics Concern  . Not on file  Social History Narrative   Lives with her children   Right handed   Caffeine: maybe 3 cups/day   Social Determinants of Health   Financial Resource Strain:   . Difficulty of Paying Living Expenses: Not on file  Food Insecurity:   . Worried About Charity fundraiser in the Last Year: Not on file  . Ran Out of Food in the Last Year: Not on file  Transportation Needs:   . Lack of Transportation (Medical): Not on file  . Lack of Transportation (Non-Medical): Not on file  Physical Activity:   . Days of Exercise per Week: Not on file  . Minutes of Exercise per Session: Not on file  Stress:   . Feeling of Stress : Not on file  Social Connections:   . Frequency of Communication with Friends and Family: Not on file  . Frequency of Social Gatherings with Friends and Family: Not on file  . Attends Religious Services: Not on file  . Active Member of Clubs or Organizations: Not on file  . Attends Archivist Meetings: Not on file  . Marital Status: Not on file  Intimate Partner Violence:   .  Fear of Current or Ex-Partner: Not on file  . Emotionally Abused: Not on file  . Physically Abused: Not on file  . Sexually Abused: Not on file    Family History  Problem Relation Age of Onset  . Healthy Mother   . Healthy Father   . Diabetes Maternal Grandmother   . Diabetes Maternal Grandfather   . Diabetes Paternal Grandmother   . Diabetes Paternal Grandfather   . Migraines Neg Hx     Past Medical History:  Diagnosis Date  . GERD (gastroesophageal reflux disease)     Patient Active Problem List   Diagnosis Date Noted  . Postpartum care following cesarean  delivery (5/6) 02/13/2014  . Cesarean delivery delivered 02/12/2014    Past Surgical History:  Procedure Laterality Date  . CESAREAN SECTION  2012  . CESAREAN SECTION N/A 02/12/2014   Procedure: Repeat CESAREAN SECTION;  Surgeon: Lovenia Kim, MD;  Location: [facility];  Service: Obstetrics;  Laterality: N/A;    Current Outpatient Medications  Medication Sig Dispense Refill  . acetaZOLAMIDE (DIAMOX) 250 MG tablet Take 1 tablet (250 mg total) by mouth 2 (two) times daily. 60 tablet 6  . phentermine (ADIPEX-P) 37.5 MG tablet Take 1 tablet by mouth daily.     No current facility-administered medications for this visit.    Allergies as of 12/10/2019  . (No Known Allergies)    Vitals: BP 131/86 (BP Location: Right Arm, Patient Position: Sitting)   Pulse (!) 101   Temp (!) 97.1 F (36.2 C) Comment: taken at front  Ht 5\' 5"  (1.651 m)   Wt 210 lb (95.3 kg)   LMP 11/19/2019 (Approximate)   BMI 34.95 kg/m  Last Weight:  Wt Readings from Last 1 Encounters:  12/10/19 210 lb (95.3 kg)   Last Height:   Ht Readings from Last 1 Encounters:  12/10/19 5\' 5"  (1.651 m)     Physical exam: Exam: Gen: NAD, conversant, well nourised, obese, well groomed                     CV: RRR, no MRG. No Carotid Bruits. No peripheral edema, warm, nontender Eyes: Conjunctivae clear without exudates or hemorrhage  Neuro: Detailed Neurologic Exam  Speech:    Speech is normal; fluent and spontaneous with normal comprehension.  Cognition:    The patient is oriented to person, place, and time;     recent and remote memory intact;     language fluent;     normal attention, concentration,     fund of knowledge Cranial Nerves:    The pupils are equal, round, and reactive to light. papilledema. Visual fields are full to finger confrontation. Extraocular movements are intact. Trigeminal sensation is intact and the muscles of mastication are normal. The face is symmetric. The palate elevates in the  midline. Hearing intact. Voice is normal. Shoulder shrug is normal. The tongue has normal motion without fasciculations.   Coordination:    Normal finger to nose and heel to shin. Normal rapid alternating movements.   Gait:    Heel-toe and tandem gait are normal.   Motor Observation:    No asymmetry, no atrophy, and no involuntary movements noted. Tone:    Normal muscle tone.    Posture:    Posture is normal. normal erect    Strength:    Strength is V/V in the upper and lower limbs.      Sensation: intact to LT     Reflex Exam:  DTR's:    Deep tendon reflexes in the upper and lower extremities are normal bilaterally.   Toes:    The toes are downgoing bilaterally.   Clonus:    Clonus is absent.    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 [facility], ophthalmology Lumbar puncture for opening pressure   Weight loss is critical, discussed weight loss and risk of permanent vision loss  After LP start Diamox 250 bid and increase to 500mg  bid at next appointment.  For any acute change especially worsening headache or vision loss call 911 and proceed to ED  To prevent or relieve headaches, try the following: . Cool Compress. Lie down and place a cool compress on your head.  . Avoid headache triggers. If certain foods or odors seem to have triggered your migraines in the past, avoid them. A headache diary might help you identify triggers.  . Include physical activity in your daily routine. Try a daily walk or other moderate aerobic exercise.  . Manage stress. Find healthy ways to cope with the stressors, such as delegating tasks on your to-do list.  . Practice relaxation techniques. Try deep breathing, yoga, massage and visualization.  . Eat regularly. Eating regularly scheduled meals and maintaining a healthy diet might help prevent headaches. Also, drink plenty of fluids.  . Follow a regular sleep schedule.  Sleep deprivation might contribute to headaches . Consider biofeedback. With this mind-body technique, you learn to control certain bodily functions -- such as muscle tension, heart rate and blood pressure -- to prevent headaches or reduce headache pain.    Proceed to emergency room if you experience new or worsening symptoms or symptoms do not resolve, if you have new neurologic symptoms or if headache is severe, or for any concerning symptom.   Provided education and documentation from American headache Society toolbox including articles on: pseudotumoer cerebri(IIH), chronic migraine medication overuse headache, chronic migraines, prevention of migraines and other headaches, behavioral and other nonpharmacologic treatments for headache.      Orders Placed This Encounter  Procedures  . DG FLUORO GUIDED LOC OF NEEDLE/CATH TIP FOR SPINAL INJECT LT  . CBC  . Comprehensive metabolic panel  . TSH  . Ambulatory referral to Ophthalmology   Meds ordered this encounter  Medications  . acetaZOLAMIDE (DIAMOX) 250 MG tablet    Sig: Take 1 tablet (250 mg total) by mouth 2 (two) times daily.    Dispense:  60 tablet    Refill:  6    Cc: Luking, Grace Bushy, MD,  [place], [facility] [address] [place], [place] [address]  Phone [phone] Fax [phone]

## 2019-12-11 ENCOUNTER — Ambulatory Visit
Admission: RE | Admit: 2019-12-11 | Discharge: 2019-12-11 | Disposition: A | Discharge status: Home / Self Care | Payer: No Typology Code available for payment source | Source: Ambulatory Visit | Attending: Neurology | Admitting: Neurology

## 2019-12-11 VITALS — BP 143/85 | HR 78

## 2019-12-11 DIAGNOSIS — H4710 Unspecified papilledema: Secondary | ICD-10-CM

## 2019-12-11 DIAGNOSIS — G932 Benign intracranial hypertension: Secondary | ICD-10-CM

## 2019-12-11 LAB — CBC
Hematocrit: 40 % (ref 34.0–46.6)
Hemoglobin: 13.6 g/dL (ref 11.1–15.9)
MCH: 29.4 pg (ref 26.6–33.0)
MCHC: 34 g/dL (ref 31.5–35.7)
MCV: 87 fL (ref 79–97)
Platelets: 335 10*3/uL (ref 150–450)
RBC: 4.62 x10E6/uL (ref 3.77–5.28)
RDW: 13.1 % (ref 11.7–15.4)
WBC: 11.2 10*3/uL — ABNORMAL HIGH (ref 3.4–10.8)

## 2019-12-11 LAB — COMPREHENSIVE METABOLIC PANEL
ALT: 32 IU/L (ref 0–32)
AST: 23 IU/L (ref 0–40)
Albumin/Globulin Ratio: 1.6 (ref 1.2–2.2)
Albumin: 4.1 g/dL (ref 3.8–4.8)
Alkaline Phosphatase: 67 IU/L (ref 39–117)
BUN/Creatinine Ratio: 15 (ref 9–23)
BUN: 13 mg/dL (ref 6–20)
Bilirubin Total: 0.3 mg/dL (ref 0.0–1.2)
CO2: 21 mmol/L (ref 20–29)
Calcium: 9.1 mg/dL (ref 8.7–10.2)
Chloride: 103 mmol/L (ref 96–106)
Creatinine, Ser: 0.85 mg/dL (ref 0.57–1.00)
GFR calc Af Amer: 103 mL/min/{1.73_m2} (ref >59)
GFR calc non Af Amer: 90 mL/min/{1.73_m2} (ref >59)
Globulin, Total: 2.5 g/dL (ref 1.5–4.5)
Glucose: 90 mg/dL (ref 65–99)
Potassium: 4 mmol/L (ref 3.5–5.2)
Sodium: 139 mmol/L (ref 134–144)
Total Protein: 6.6 g/dL (ref 6.0–8.5)

## 2019-12-11 LAB — CSF CELL COUNT WITH DIFFERENTIAL
RBC Count, CSF: 0 cells/uL
WBC, CSF: 2 cells/uL (ref 0–5)

## 2019-12-11 LAB — GLUCOSE, CSF: Glucose, CSF: 55 mg/dL (ref 40–80)

## 2019-12-11 LAB — TSH: TSH: 0.868 u[IU]/mL (ref 0.450–4.500)

## 2019-12-11 LAB — PROTEIN, CSF: Total Protein, CSF: 26 mg/dL (ref 15–45)

## 2019-12-11 IMAGING — XA DG FLUORO GUIDE SPINAL/SI JT INJ*L*
1 series · 1 of 1 positions shown · non-contrast
Comparison: none

CLINICAL DATA: Papilledema.  Idiopathic intracranial hypertension.

[Series 1: ortho adipose · 1 of 1 slices shown]
[im 1/1]
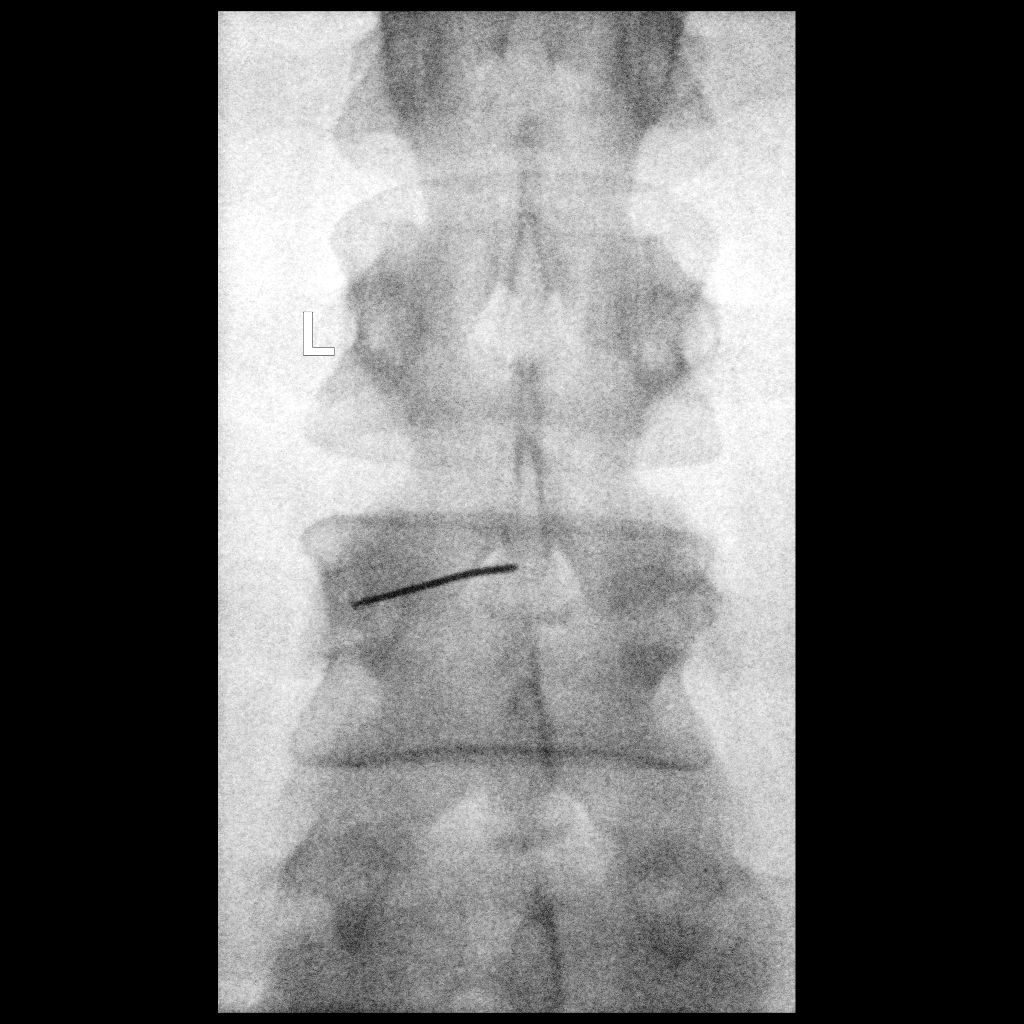

[1 of 1 positions shown; findings below may reference images not displayed]

EXAM:
DIAGNOSTIC LUMBAR PUNCTURE UNDER FLUOROSCOPIC GUIDANCE

FLUOROSCOPY TIME:  Fluoroscopy Time:  9 seconds

Radiation Exposure Index (if provided by the fluoroscopic device):
10.92 microGray*m^2

Number of Acquired Spot Images: 0

PROCEDURE:
Informed consent was obtained from the patient prior to the
procedure, including potential complications of headache, allergy,
and pain. With the patient prone, the lower back was prepped with
Betadine. 1% Lidocaine was used for local anesthesia. Lumbar
puncture was performed at the L3-4 level using a 6 inch 20 gauge
needle via a left interlaminar approach with return of clear CSF
with an opening pressure of 37 cm water (measured in the left
lateral decubitus position). 17 mL of CSF were obtained for
laboratory studies and to normalize the pressure. Closing pressure
was 15 cm water. The patient tolerated the procedure well and there
were no apparent complications.
IMPRESSION: Successful fluoroscopically guided lumbar puncture. Elevated opening
pressure of 37 cm water.

## 2019-12-11 NOTE — Discharge Instructions (Signed)

## 2019-12-19 MED FILL — PHENTERMINE 37.5 MG TABLET: 37.5 | 30 days supply | Qty: 30 | Fill #2

## 2020-01-23 ENCOUNTER — Other Ambulatory Visit: Payer: Self-pay

## 2020-01-23 ENCOUNTER — Ambulatory Visit (INDEPENDENT_AMBULATORY_CARE_PROVIDER_SITE_OTHER)
Admission: RE | Admit: 2020-01-23 | Discharge: 2020-01-23 | Disposition: A | Discharge status: Home / Self Care | Payer: No Typology Code available for payment source | Source: Ambulatory Visit

## 2020-01-23 DIAGNOSIS — J0100 Acute maxillary sinusitis, unspecified: Secondary | ICD-10-CM | POA: Diagnosis not present

## 2020-01-23 MED ORDER — AZITHROMYCIN 250 MG PO TABS: 250.0000 mg | ORAL_TABLET | Freq: Every day | ORAL | 0 refills | Status: DC

## 2020-01-23 MED ORDER — FLUTICASONE PROPIONATE 50 MCG/ACT NA SUSP: 1.0000 | Freq: Every day | NASAL | 0 refills | Status: DC

## 2020-01-23 NOTE — ED Provider Notes (Addendum)
Virtual Visit via Video Note:  [name]  initiated request for Telemedicine visit with [facility] team. I connected with Paula Burgess  on 01/23/2020 at 9:53 AM  for a synchronized telemedicine visit using a video enabled HIPPA compliant telemedicine application. I verified that I am speaking with Paula Burgess  using two identifiers. Emerson Monte, FNP  was physically located in a [facility] Urgent care site and JASARA DARBYSHIRE was located at a different location.   The limitations of evaluation and management by telemedicine as well as the availability of in-person appointments were discussed. Patient was informed that she  may incur a bill ( including co-pay) for this virtual visit encounter. Paula Burgess  expressed understanding and gave verbal consent to proceed with virtual visit.     History of Present Illness:Paula Burgess  is a 34 y.o. female presents via telehealth with a complaint of cough, sinus infection  with green discharge for the past few days.  Has used OTC medication without relief.  Reports symptom has been getting worse.  Denies sick exposure to COVID, flu or strep.  Denies recent travel.  Denies aggravating or alleviating symptoms.  Denies previous COVID infection.   Denies fever, chills, fatigue, rhinorrhea, sore throat, cough, SOB, wheezing, chest pain, nausea, vomiting, changes in bowel or bladder habits.    Past Medical History:  Diagnosis Date  . GERD (gastroesophageal reflux disease)     No Known Allergies      Observations/Objective: VITALS: Per patient if applicable, see vitals. GENERAL: Alert, appears well and in no acute distress. NECK: Normal movements of the head and neck. CARDIOPULMONARY: No increased WOB. Speaking in clear sentences. I:E ratio WNL.  PSYCH: Pleasant and cooperative, well-groomed. Speech normal rate and rhythm. Affect is appropriate. Insight and judgement are appropriate. Attention is focused,  linear, and appropriate.  NEURO:  Oriented as arrived to appointment on time with no prompting. Moves both UE equally.    Assessment and Plan:   ICD-10-CM   1. Acute non-recurrent maxillary sinusitis  J01.00 azithromycin (ZITHROMAX) 250 MG tablet    fluticasone (FLONASE) 50 MCG/ACT nasal spray    Follow Up Instructions: Follow-up with PCP Return for a face-to-face visit if symptom does not resolve   I discussed the assessment and treatment plan with the patient. The patient was provided an opportunity to ask questions and all were answered. The patient agreed with the plan and demonstrated an understanding of the instructions.   The patient was advised to call back or seek an in-person evaluation if the symptoms worsen or if the condition fails to improve as anticipated.  I provided 15 minutes of non-face-to-face time during this encounter.                Emerson Monte, FNP 01/23/20 1009

## 2020-01-23 NOTE — Discharge Instructions (Signed)
Take medication as prescribed Follow-up with PCP Return for a face-to-face visit if symptom does not resolve

## 2020-01-26 NOTE — Progress Notes (Signed)
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 Opening pressue was 37 cm water. Started on diamox 250mg  bid. She says her symptoms are "1000x better" on the diamox. She hasn't seen Dr. Katy Fitch, we will give her the number to call. She still has a little tinnitus but no hedaches very much improved, she is thrilled. She declines the [facility] and [place], she is on phentermine and she has lost 45 pounds.   HPI:  Paula Burgess is a 34 y.o. female here as requested by Mikey Kirschner, MD for optic nerve edema. Tinniitus started about a year ago.Went to ENT who ordered MRI of the brain and cat scan. She has headaches, she has headaches, she wakes up with headaches, can be severe. Mostly dull. But severe ones can be ice-pick. Headache son the right side behind the right eye. Mostly dull. Daily but moderate in pain. She says the ringing in the ears is more swishing. Worse when up and moving around. She has blurry vison but not all the time. Stress may make things worse. She went to see the eye doctor and has new glasses which are not helping. Nothing makes it better. Continues moderate headache with severe exacerbations, blurry vision, pulsating in the ears. When the symptoms started she had gained 60 lbs. Losing 40 lbs has helped. She has chronic neck pain. No IUD, no long-term antibiotics or vitamin A. No other focal neurologic deficits, associated symptoms, inciting events or modifiable factors.  Reviewed notes, labs and imaging from outside physicians, which showed :  Reviewed CTA of the head: 1. Negative head CTA. 2. Partially empty sella.  Partially empty sella turcica. This is very commonly an incidental finding, but may be seen in the setting of idiopathic  intracranial Hypertension. Personally reviewed MRi brain.  Review of Systems: Patient complains of symptoms per HPI as well as the following symptoms: No dizziness, headache resolved, no CP, no changes in urination. Pertinent negatives and positives per HPI. All others negative    Social History   Socioeconomic History  . Marital status: Divorced    Spouse name: Not on file  . Number of children: 2  . Years of education: Not on file  . Highest education level: Associate degree: occupational, Hotel manager, or vocational program  Occupational History  . Not on file  Tobacco Use  . Smoking status: Current Every Day Smoker    Packs/day: 0.50    Types: Cigarettes  . Smokeless tobacco: Never Used  Substance and Sexual Activity  . Alcohol use: No  . Drug use: No  . Sexual activity: Not on file  Other Topics Concern  . Not on file  Social History Narrative   Lives with her children   Right handed   Caffeine: maybe 3 cups/day   Social Determinants of Health   Financial Resource Strain:   . Difficulty of Paying Living Expenses:   Food Insecurity:   . Worried About Charity fundraiser in the Last Year:   . Arboriculturist in the Last Year:   Transportation Needs:   . Film/video editor (Medical):   Marland Kitchen Lack of Transportation (Non-Medical):   Physical Activity:   . Days of Exercise per Week:   . Minutes of Exercise per Session:   Stress:   .  Feeling of Stress :   Social Connections:   . Frequency of Communication with Friends and Family:   . Frequency of Social Gatherings with Friends and Family:   . Attends Religious Services:   . Active Member of Clubs or Organizations:   . Attends Archivist Meetings:   Marland Kitchen Marital Status:   Intimate Partner Violence:   . Fear of Current or Ex-Partner:   . Emotionally Abused:   Marland Kitchen Physically Abused:   . Sexually Abused:     Family History  Problem Relation Age of Onset  . Healthy Mother   . Healthy Father   . Diabetes  Maternal Grandmother   . Diabetes Maternal Grandfather   . Diabetes Paternal Grandmother   . Diabetes Paternal Grandfather   . Migraines Neg Hx   . Pseudotumor cerebri Neg Hx     Past Medical History:  Diagnosis Date  . GERD (gastroesophageal reflux disease)     Patient Active Problem List   Diagnosis Date Noted  . IIH (idiopathic intracranial hypertension) 12/10/2019  . Papilledema 12/10/2019  . Postpartum care following cesarean delivery (5/6) 02/13/2014  . Cesarean delivery delivered 02/12/2014    Past Surgical History:  Procedure Laterality Date  . CESAREAN SECTION  2012  . CESAREAN SECTION N/A 02/12/2014   Procedure: Repeat CESAREAN SECTION;  Surgeon: Lovenia Kim, MD;  Location: [facility];  Service: Obstetrics;  Laterality: N/A;    Current Outpatient Medications  Medication Sig Dispense Refill  . acetaZOLAMIDE (DIAMOX) 250 MG tablet Take 1 tablet (250 mg total) by mouth 2 (two) times daily. 180 tablet 3  . azithromycin (ZITHROMAX) 250 MG tablet Take 1 tablet (250 mg total) by mouth daily. Take first 2 tablets together, then 1 every day until finished. 6 tablet 0  . fluticasone (FLONASE) 50 MCG/ACT nasal spray Place 1 spray into both nostrils daily for 14 days. 16 g 0  . phentermine (ADIPEX-P) 37.5 MG tablet Take 1 tablet by mouth daily.     No current facility-administered medications for this visit.    Allergies as of 01/27/2020  . (No Known Allergies)    Vitals: BP 133/89 (BP Location: Right Arm, Patient Position: Sitting)   Pulse 98   Temp (!) 96.4 F (35.8 C) Comment: taken at front  Ht 5\' 4"  (1.626 m)   Wt 208 lb (94.3 kg)   BMI 35.70 kg/m  Last Weight:  Wt Readings from Last 1 Encounters:  01/27/20 208 lb (94.3 kg)   Last Height:   Ht Readings from Last 1 Encounters:  01/27/20 5\' 4"  (1.626 m)    Physical exam: Exam: Gen: NAD, conversant, well nourised, obese, well groomed                     CV: RRR, no MRG. No Carotid Bruits. No peripheral  edema, warm, nontender Eyes: Conjunctivae clear without exudates or hemorrhage  Neuro: Detailed Neurologic Exam  Speech:    Speech is normal; fluent and spontaneous with normal comprehension.  Cognition:    The patient is oriented to person, place, and time;     recent and remote memory intact;     language fluent;     normal attention, concentration,     fund of knowledge Cranial Nerves:    The pupils are equal, round, and reactive to light. Mild bilateral papilledema. Visual fields are full to finger confrontation. Extraocular movements are intact. Trigeminal sensation is intact and the muscles of mastication are normal. The  face is symmetric. The palate elevates in the midline. Hearing intact. Voice is normal. Shoulder shrug is normal. The tongue has normal motion without fasciculations.   Motor Observation:    No asymmetry, no atrophy, and no involuntary movements noted.   Assessment/Plan:  34 year old ere for follow up of IDIOPATHIC INTRACRANIAL HYPERTENSION. Opening pressue was 37 cm water. Started on diamox 250mg  bid. She says her symptoms are "1000x better" on the diamox.   Continue Diamox at current dose, see Midge Aver, gave her the number to call (they called her and she forgot the name) Still with papilledema, may take time to resolve MRI with partially empty sella, opening pressure 37 Dr. Midge Aver, ophthalmology - gave her his number Lumbar puncture for opening pressure 37 cm h2o  Weight loss is critical, discussed weight loss and risk of permanent vision loss - discussed the [facility] she declines, she has lost 45 pounds on her own  For any acute change especially worsening headache or vision loss call 911 and proceed to ED especially vision changes   Cc: Mikey Kirschner, MD,  Midge Aver, MD  Sarina Ill, MD  [facility] [address] [place], [place] [address]  Phone [phone] Fax  [phone]  I spent 15 minutes of face-to-face and non-face-to-face time with patient on the  1. IIH (idiopathic intracranial hypertension)   2. Papilledema    diagnosis.  This included previsit chart review, lab review, study review, order entry, electronic health record documentation, patient education on the different diagnostic and therapeutic options, counseling and coordination of care, risks and benefits of management, compliance, or risk factor reduction

## 2020-01-27 ENCOUNTER — Encounter: Payer: Self-pay | Admitting: Neurology

## 2020-01-27 ENCOUNTER — Ambulatory Visit: Payer: No Typology Code available for payment source | Admitting: Neurology

## 2020-01-27 ENCOUNTER — Other Ambulatory Visit: Payer: Self-pay

## 2020-01-27 VITALS — BP 133/89 | HR 98 | Temp 96.4°F | Ht 64.0 in | Wt 208.0 lb

## 2020-01-27 DIAGNOSIS — H4710 Unspecified papilledema: Secondary | ICD-10-CM | POA: Diagnosis not present

## 2020-01-27 DIAGNOSIS — G932 Benign intracranial hypertension: Secondary | ICD-10-CM

## 2020-01-27 MED ORDER — ACETAZOLAMIDE 250 MG PO TABS: 250.0000 mg | ORAL_TABLET | Freq: Two times a day (BID) | ORAL | 3 refills | Status: DC

## 2020-01-27 MED FILL — ACETAZOLAMIDE 250 MG TABS: 250 | 90 days supply | Qty: 180 | Fill #0

## 2020-02-03 MED FILL — PHENTERMINE 37.5 MG TABLET: 37.5 | 30 days supply | Qty: 30 | Fill #0

## 2020-03-10 MED FILL — PHENTERMINE 37.5 MG TABLET: 37.5 | 30 days supply | Qty: 30 | Fill #1

## 2020-04-16 MED FILL — PHENTERMINE 37.5 MG TABLET: 37.5 | 30 days supply | Qty: 30 | Fill #2

## 2020-04-30 ENCOUNTER — Other Ambulatory Visit (HOSPITAL_COMMUNITY): Payer: Self-pay | Admitting: Obstetrics and Gynecology

## 2020-04-30 LAB — HM PAP SMEAR: HM Pap smear: NEGATIVE

## 2020-04-30 MED FILL — NORETHINDRONE 0.35 MG TAB: 0.35 | 28 days supply | Qty: 28 | Fill #0

## 2020-05-27 MED FILL — PHENTERMINE 37.5 MG TABLET: 37.5 | 30 days supply | Qty: 30 | Fill #0

## 2020-06-09 MED FILL — ACETAZOLAMIDE 250 MG TABS: 250 | 90 days supply | Qty: 180 | Fill #1

## 2020-06-09 MED FILL — NORETHINDRONE 0.35 MG TAB: 0.35 | 28 days supply | Qty: 28 | Fill #1

## 2020-07-17 ENCOUNTER — Other Ambulatory Visit (HOSPITAL_COMMUNITY): Payer: Self-pay | Admitting: Obstetrics and Gynecology

## 2020-07-18 MED FILL — PHENTERMINE 37.5 MG TABLET: 37.5 | 30 days supply | Qty: 30 | Fill #0

## 2020-07-20 MED FILL — NORETHINDRONE 0.35 MG TAB: 0.35 | 84 days supply | Qty: 84 | Fill #2

## 2020-07-21 ENCOUNTER — Other Ambulatory Visit: Payer: Self-pay | Admitting: Neurology

## 2020-07-21 ENCOUNTER — Telehealth: Payer: Self-pay | Admitting: Neurology

## 2020-07-21 ENCOUNTER — Other Ambulatory Visit (INDEPENDENT_AMBULATORY_CARE_PROVIDER_SITE_OTHER): Payer: Self-pay

## 2020-07-21 DIAGNOSIS — Z0289 Encounter for other administrative examinations: Secondary | ICD-10-CM

## 2020-07-21 DIAGNOSIS — G9601 Cranial cerebrospinal fluid leak, spontaneous: Secondary | ICD-10-CM

## 2020-07-21 DIAGNOSIS — G9600 Cerebrospinal fluid leak, unspecified: Secondary | ICD-10-CM

## 2020-07-21 NOTE — Telephone Encounter (Signed)
See phone note from 07/21/2020. Lab order placed. Pt coming today.

## 2020-07-21 NOTE — Addendum Note (Signed)
Addended by: Gildardo Griffes on: 07/21/2020 12:11 PM   Modules accepted: Orders

## 2020-07-21 NOTE — Telephone Encounter (Signed)
Spoke with lab. Can collect nasal drainage and send off for testing for B2 transferrin. Dr. Jaynee Eagles aware. Order placed. Called pt and let her know she can come by this afternoon. Pt's questions were answered. She verbalized appreciation for the call.

## 2020-07-21 NOTE — Telephone Encounter (Signed)
Pt will be in the area this afternoon and wanted to come by for the testing of the fluid, possible spinal fluid.  Please call back as soon as possible.

## 2020-07-22 ENCOUNTER — Other Ambulatory Visit: Payer: Self-pay

## 2020-07-26 LAB — BETA-2 TRANSFERRIN

## 2020-07-27 ENCOUNTER — Telehealth: Payer: Self-pay | Admitting: Neurology

## 2020-07-27 DIAGNOSIS — G9601 Cranial cerebrospinal fluid leak, spontaneous: Secondary | ICD-10-CM

## 2020-07-27 NOTE — Telephone Encounter (Signed)
Pt called, had a message from Dr. Jaynee Eagles on MyChart to call Paula Burgess about sending me to a ENT. Would like a call from the nurse.

## 2020-07-27 NOTE — Telephone Encounter (Signed)
Paula Beam, MD  07/26/2020  9:53 PM EDT Back to Top    CSF positive in her nasal discharge, we need to send her to an ENT doctor. Please ask if she has a preference or I can ask Hinton Dyer to call and see which ENT would be best thanks

## 2020-07-27 NOTE — Telephone Encounter (Signed)
I returned the patient's call. She would like to be referred to Dr Lucia Gaskins (ENT). Pt also stated she stopped the Diamox on 07/19/20 and would like to know if she is supposed to continue the Diamox or stay off. She stated the Tinnitus has been back for 6-8 weeks and the constant nasal drainage started 5 weeks ago. I let her know a message would be sent to the provider to advise.   Spoke with Dr Jaynee Eagles. Placed v.o. for ENT referral to Dr Lucia Gaskins for CSF leak from nose. Per Dr Jaynee Eagles, ok to remain off Diamox for now.

## 2020-07-28 ENCOUNTER — Other Ambulatory Visit: Payer: Self-pay

## 2020-07-28 ENCOUNTER — Ambulatory Visit (INDEPENDENT_AMBULATORY_CARE_PROVIDER_SITE_OTHER): Payer: No Typology Code available for payment source | Admitting: Otolaryngology

## 2020-07-28 ENCOUNTER — Encounter (INDEPENDENT_AMBULATORY_CARE_PROVIDER_SITE_OTHER): Payer: Self-pay | Admitting: Otolaryngology

## 2020-07-28 ENCOUNTER — Ambulatory Visit (INDEPENDENT_AMBULATORY_CARE_PROVIDER_SITE_OTHER): Payer: Self-pay | Admitting: Otolaryngology

## 2020-07-28 VITALS — Temp 97.2°F

## 2020-07-28 DIAGNOSIS — G9601 Cranial cerebrospinal fluid leak, spontaneous: Secondary | ICD-10-CM

## 2020-07-28 NOTE — Progress Notes (Signed)
HPI: Paula Burgess is a 34 y.o. female who presents is referred by Dr. Jaynee Burgess for evaluation of left nasal CSF leak.  Patient states that she has been having clear drainage from the left nostril for 5 to 6 weeks.  She has history of intracranial hypertension.  She apparently has some of the fluid sent for examination and was consistent with CSF.Marland Kitchen  Past Medical History:  Diagnosis Date  . GERD (gastroesophageal reflux disease)    Past Surgical History:  Procedure Laterality Date  . CESAREAN SECTION  2012  . CESAREAN SECTION N/A 02/12/2014   Procedure: Repeat CESAREAN SECTION;  Surgeon: Paula Kim, Burgess;  Location: [facility];  Service: Obstetrics;  Laterality: N/A;   Social History   Socioeconomic History  . Marital status: Divorced    Spouse name: Not on file  . Number of children: 2  . Years of education: Not on file  . Highest education level: Associate degree: occupational, Hotel manager, or vocational program  Occupational History  . Not on file  Tobacco Use  . Smoking status: Current Every Day Smoker    Packs/day: 0.50    Years: 15.00    Pack years: 7.50    Types: Cigarettes    Start date: 2006  . Smokeless tobacco: Never Used  Vaping Use  . Vaping Use: Never used  Substance and Sexual Activity  . Alcohol use: No  . Drug use: No  . Sexual activity: Not on file  Other Topics Concern  . Not on file  Social History Narrative   Lives with her children   Right handed   Caffeine: maybe 3 cups/day   Social Determinants of Health   Financial Resource Strain:   . Difficulty of Paying Living Expenses: Not on file  Food Insecurity:   . Worried About Charity fundraiser in the Last Year: Not on file  . Ran Out of Food in the Last Year: Not on file  Transportation Needs:   . Lack of Transportation (Medical): Not on file  . Lack of Transportation (Non-Medical): Not on file  Physical Activity:   . Days of Exercise per Week: Not on file  . Minutes of Exercise per Session:  Not on file  Stress:   . Feeling of Stress : Not on file  Social Connections:   . Frequency of Communication with Friends and Family: Not on file  . Frequency of Social Gatherings with Friends and Family: Not on file  . Attends Religious Services: Not on file  . Active Member of Clubs or Organizations: Not on file  . Attends Archivist Meetings: Not on file  . Marital Status: Not on file   Family History  Problem Relation Age of Onset  . Healthy Mother   . Healthy Father   . Diabetes Maternal Grandmother   . Diabetes Maternal Grandfather   . Diabetes Paternal Grandmother   . Diabetes Paternal Grandfather   . Migraines Neg Hx   . Pseudotumor cerebri Neg Hx    No Known Allergies Prior to Admission medications   Medication Sig Start Date End Date Taking? Authorizing Provider  acetaZOLAMIDE (DIAMOX) 250 MG tablet Take 1 tablet (250 mg total) by mouth 2 (two) times daily. 01/27/20  Yes Paula Beam, Burgess  azithromycin (ZITHROMAX) 250 MG tablet Take 1 tablet (250 mg total) by mouth daily. Take first 2 tablets together, then 1 every day until finished. 01/23/20  Yes Avegno, Darrelyn Hillock, FNP  phentermine (ADIPEX-P) 37.5 MG tablet  Take 1 tablet by mouth daily. 10/16/19  Yes Paula Burgess  fluticasone (FLONASE) 50 MCG/ACT nasal spray Place 1 spray into both nostrils daily for 14 days. 01/23/20 02/06/20  Avegno, Darrelyn Hillock, FNP     Positive ROS: Otherwise negative  All other systems have been reviewed and were otherwise negative with the exception of those mentioned in the HPI and as above.  Physical Exam: Constitutional: Alert, well-appearing, no acute distress Ears: External ears without lesions or tenderness. Ear canals are clear bilaterally with intact, clear TMs.  Nasal: External nose without lesions. Septum relatively midline with mild rhinitis..  With patient leaning forward she has an obvious clear liquid dripping from the left nostril consistent with CSF  grossly. Oral: Lips and gums without lesions. Tongue and palate mucosa without lesions. Posterior oropharynx clear. Neck: No palpable adenopathy or masses Respiratory: Breathing comfortably  Skin: No facial/neck lesions or rash noted.  Procedures  Assessment: Left nasal sinus CSF leak. History of intracranial hypertension.  Plan: I discussed with Paula Burgess today that I think this would best be handled at [facility] as they perform more of these procedures and will contact [facility] for her to arrange an appointment.   Paula Journey, Burgess   CC:

## 2020-07-29 ENCOUNTER — Telehealth (INDEPENDENT_AMBULATORY_CARE_PROVIDER_SITE_OTHER): Payer: Self-pay

## 2020-08-03 ENCOUNTER — Encounter (INDEPENDENT_AMBULATORY_CARE_PROVIDER_SITE_OTHER): Payer: Self-pay

## 2020-08-03 NOTE — Progress Notes (Signed)
Referral to [facility] for CSF leak

## 2020-08-05 ENCOUNTER — Other Ambulatory Visit: Payer: Self-pay | Admitting: Otolaryngology

## 2020-08-05 DIAGNOSIS — G9601 Cranial cerebrospinal fluid leak, spontaneous: Secondary | ICD-10-CM

## 2020-08-13 ENCOUNTER — Other Ambulatory Visit: Payer: Self-pay | Admitting: Otolaryngology

## 2020-08-14 ENCOUNTER — Ambulatory Visit
Admission: RE | Admit: 2020-08-14 | Discharge: 2020-08-14 | Disposition: A | Discharge status: Home / Self Care | Payer: No Typology Code available for payment source | Source: Ambulatory Visit | Attending: Otolaryngology | Admitting: Otolaryngology

## 2020-08-14 DIAGNOSIS — G9601 Cranial cerebrospinal fluid leak, spontaneous: Secondary | ICD-10-CM

## 2020-08-14 IMAGING — CT CT MAXILLOFACIAL W/O CM
3 of 5 series · 12 of 47 positions shown, 14 images · non-contrast
Comparison: [DATE]

CLINICAL DATA: Left-sided CSF rhinorrhea. History of intracranial
hypertension.

EXAM:
CT MAXILLOFACIAL WITHOUT CONTRAST
TECHNIQUE: Multidetector CT images of the paranasal sinuses were obtained using
the standard protocol without intravenous contrast.

[Series 4: sinus 2.00 hr60 s3 cor · coronal · 0.21mm/px · 3 of 117 slices shown]
[im 39/117  bone]
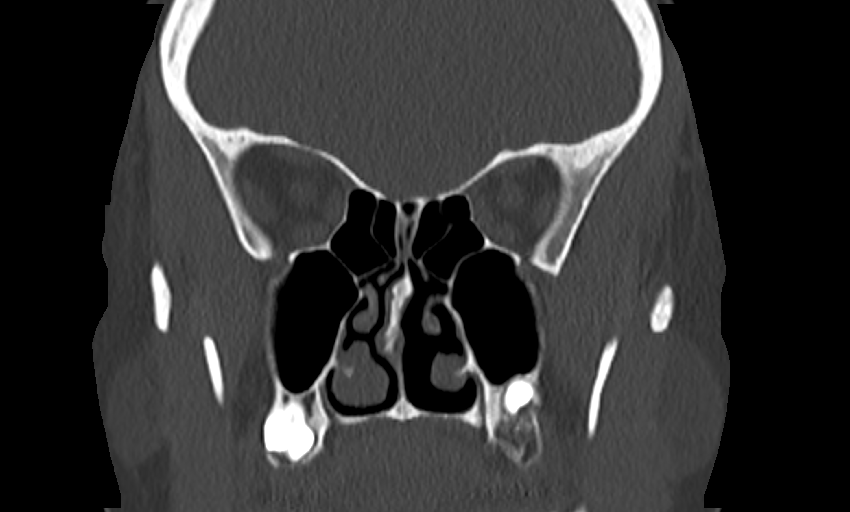
[im 52/117  bone]
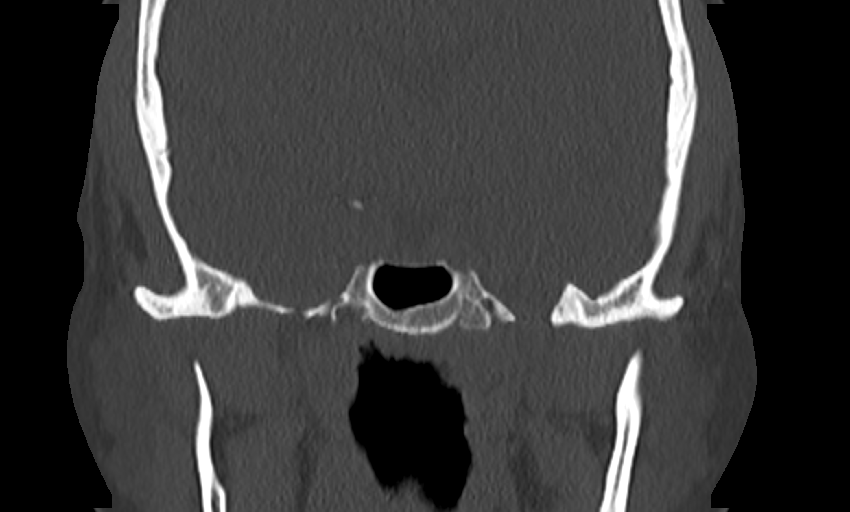
[im 65/117  bone]
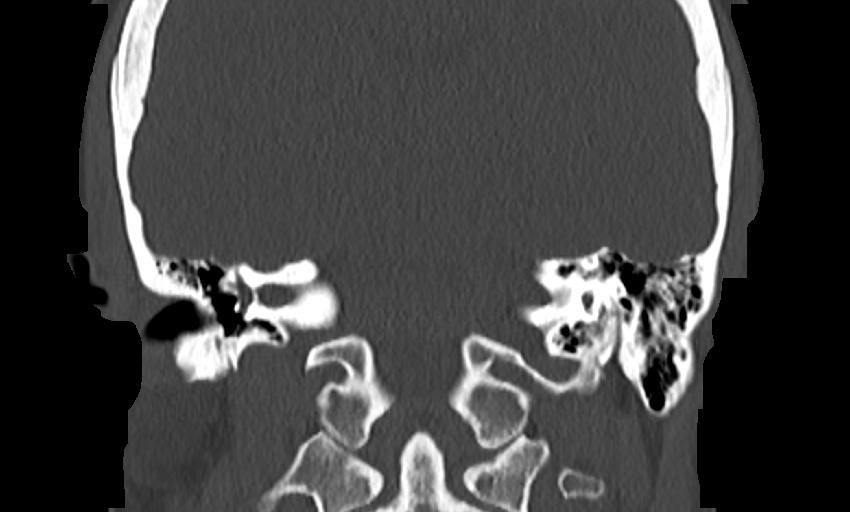

[Series 6: sinus 2.00 hr60 s3 sag · sagittal · 0.21mm/px · 3 of 89 slices shown]
[im 30/89  bone]
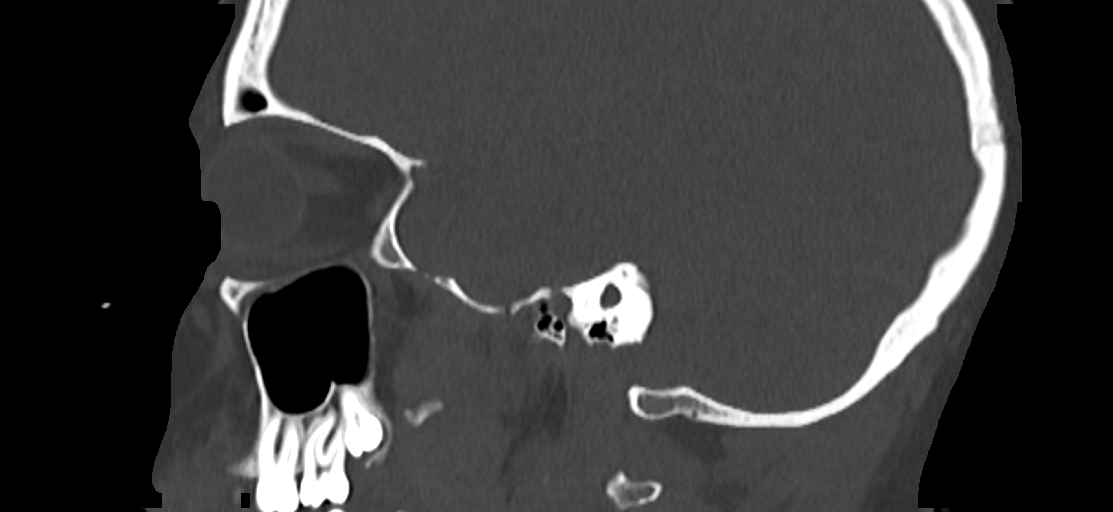
[im 45/89  bone]
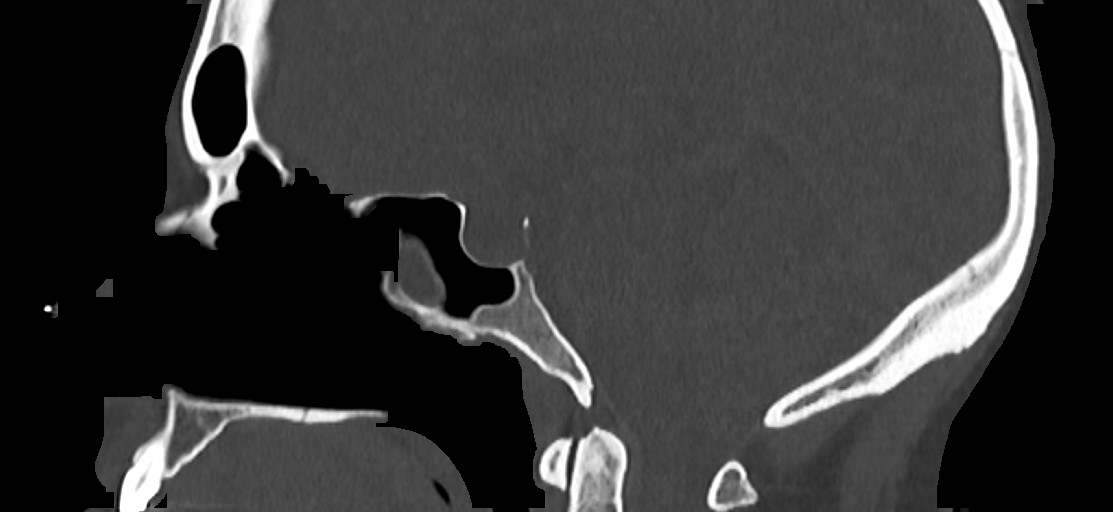
[im 59/89  bone]
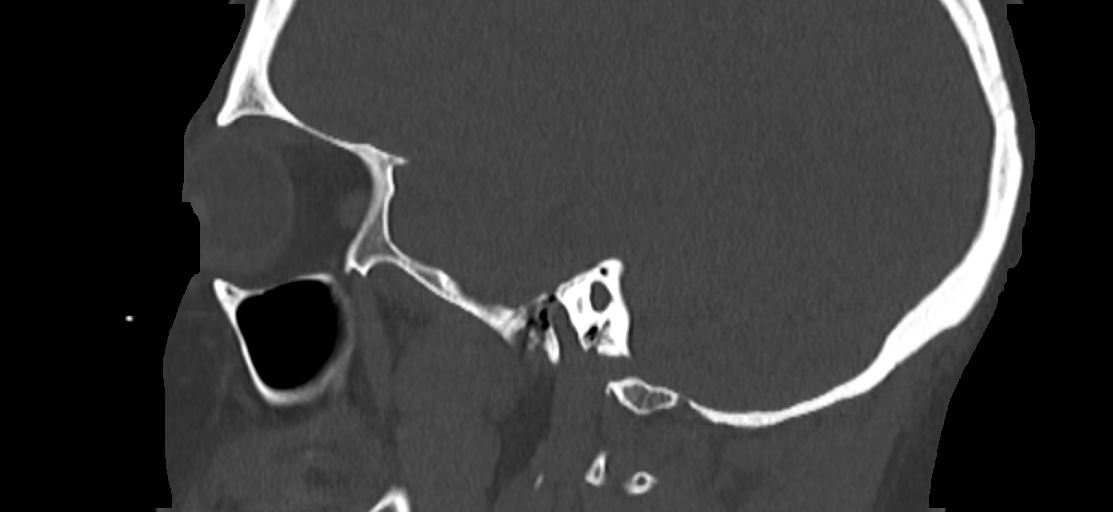

[Series 12: sinus 1.00 hr60 s3 axial fusion thins · axial · 0.46mm/px · z∈[-618,-536]mm · 6 of 177 slices shown, 8 images]
[im 20/177  brain]
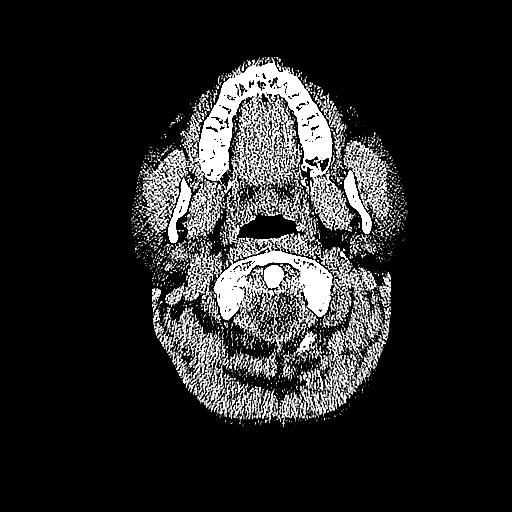
[im 20/177  bone]
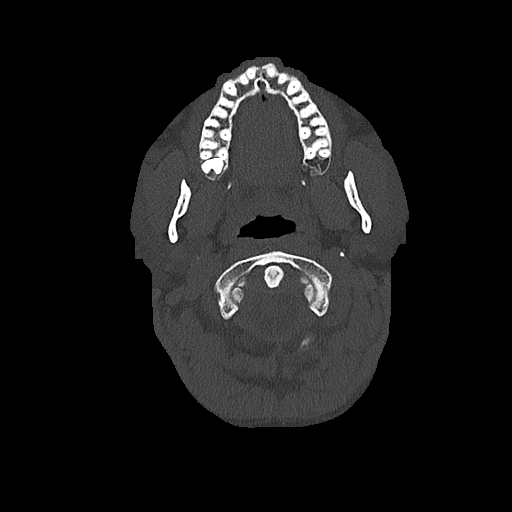
[im 49/177  bone]
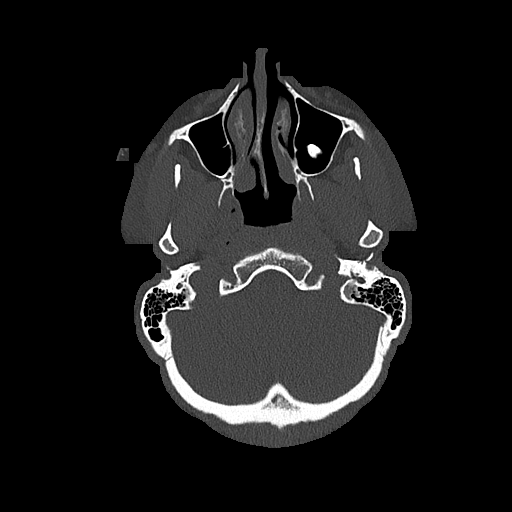
[im 79/177  bone]
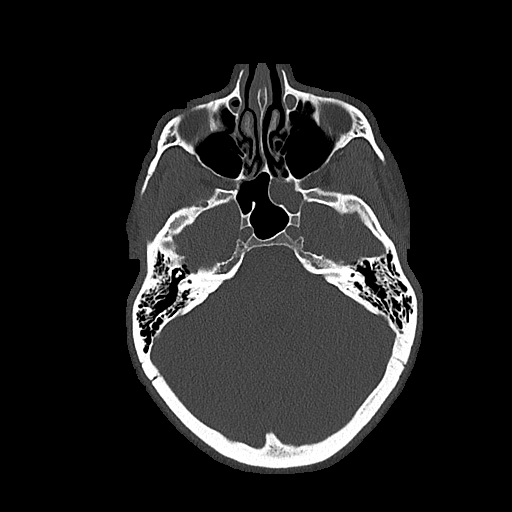
[im 98/177  bone]
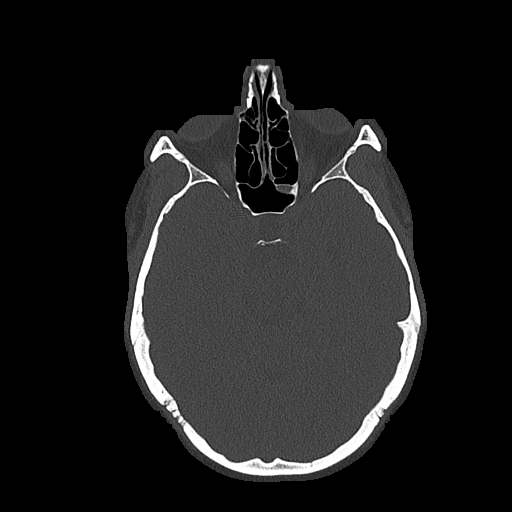
[im 128/177  brain]
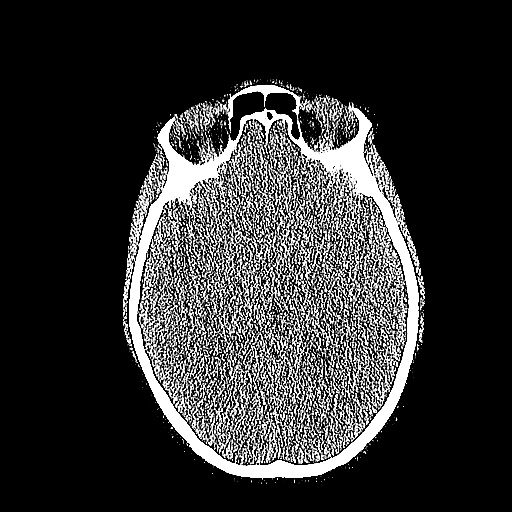
[im 128/177  bone]
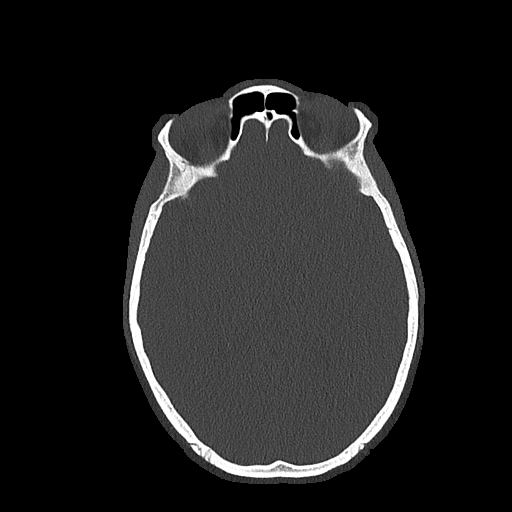
[im 157/177  bone]
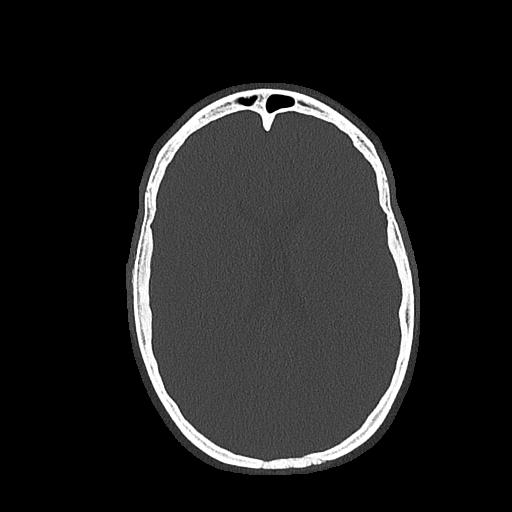

[12 of 47 positions shown; findings below may reference images not displayed]

FINDINGS: Paranasal sinuses:

Frontal: Normally aerated. Patent frontal sinus drainage pathways.

Ethmoid: Normally aerated.

Maxillary: Normally aerated.

Sphenoid: Completely opacified left sphenoid sinus which is new from
prior. The left sphenoid sinus is non dominant. No superimposed bony
dehiscence.

Right ostiomeatal unit: Patent.

Left ostiomeatal unit: Patent.

Nasal passages: Patent. Rightward nasal septal deviation and
spurring contacting the inferior turbinate.

Anatomy: No pneumatization superior to anterior ethmoid notches.
Sellar sphenoid pneumatization pattern. No dehiscence of carotid or
optic canals. No onodi cell.

Other: No incidental finding on soft tissue windows.
IMPRESSION: Completely opacified left sphenoid sinus, new from [DATE]. No
visible skull base dehiscence or sinus fluid level. Consider thin
section MRI correlate to determine the characteristics of the
sphenoid sinus contents.

## 2020-08-19 ENCOUNTER — Other Ambulatory Visit: Payer: No Typology Code available for payment source

## 2020-08-27 MED FILL — PHENTERMINE 37.5 MG TABLET: 37.5 | 30 days supply | Qty: 30 | Fill #1

## 2020-09-09 ENCOUNTER — Other Ambulatory Visit: Payer: Self-pay | Admitting: Otolaryngology

## 2020-09-09 DIAGNOSIS — G9601 Cranial cerebrospinal fluid leak, spontaneous: Secondary | ICD-10-CM

## 2020-09-10 ENCOUNTER — Other Ambulatory Visit: Payer: Self-pay | Admitting: Otolaryngology

## 2020-09-10 DIAGNOSIS — G9601 Cranial cerebrospinal fluid leak, spontaneous: Secondary | ICD-10-CM

## 2020-09-11 ENCOUNTER — Ambulatory Visit: Payer: Self-pay

## 2020-09-29 ENCOUNTER — Ambulatory Visit
Admission: RE | Admit: 2020-09-29 | Discharge: 2020-09-29 | Disposition: A | Discharge status: Home / Self Care | Payer: No Typology Code available for payment source | Source: Ambulatory Visit | Attending: Otolaryngology | Admitting: Otolaryngology

## 2020-09-29 ENCOUNTER — Other Ambulatory Visit: Payer: No Typology Code available for payment source

## 2020-09-29 DIAGNOSIS — G9601 Cranial cerebrospinal fluid leak, spontaneous: Secondary | ICD-10-CM

## 2020-09-29 IMAGING — MR MR FACE/TRIGEMINAL WO/W CM
5 of 8 series · 25 of 48 positions shown · IV contrast (multihance)
Comparison: CT maxillofacial [DATE].

CLINICAL DATA: CSF leak from nose.

EXAM:
MRI FACE TRIGEMINAL WITHOUT AND WITH CONTRAST
TECHNIQUE: Multiplanar, multiecho pulse sequences of the face and surrounding
structures, including thin slice imaging of the course of the
Trigeminal Nerves, were obtained both before and after
administration of intravenous contrast.
CONTRAST:  19mL MULTIHANCE GADOBENATE DIMEGLUMINE 529 MG/ML IV SOLN

[Series 2: T1 · sagittal · 3.0mm · 0.35mm/px · 4 of 40 slices shown (1 of 3)]
[im 1/40]
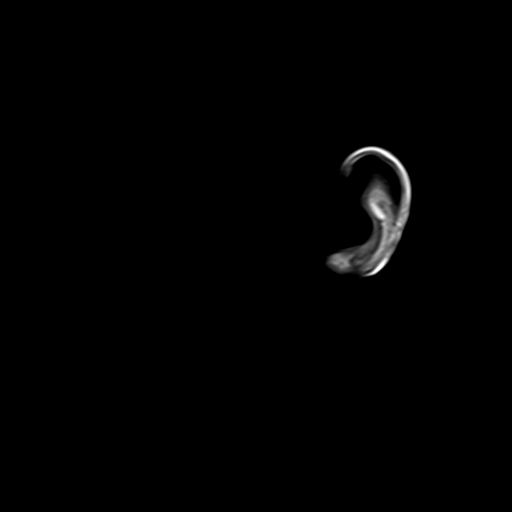
[im 14/40]
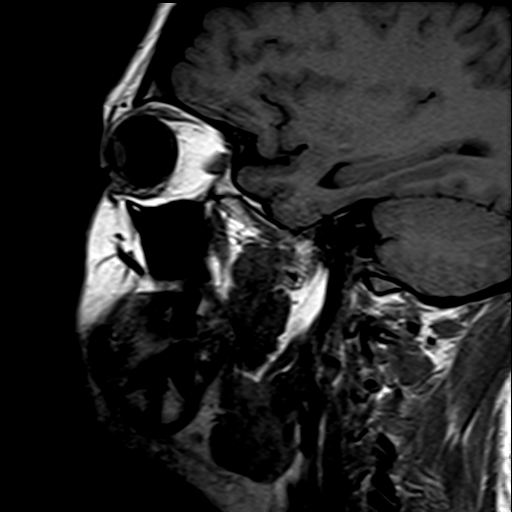
[im 27/40]
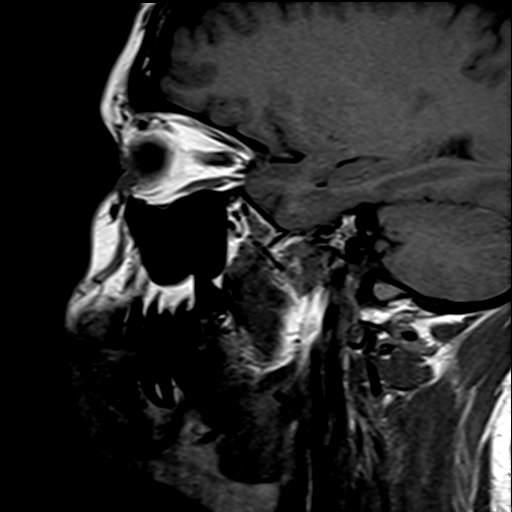
[im 40/40]
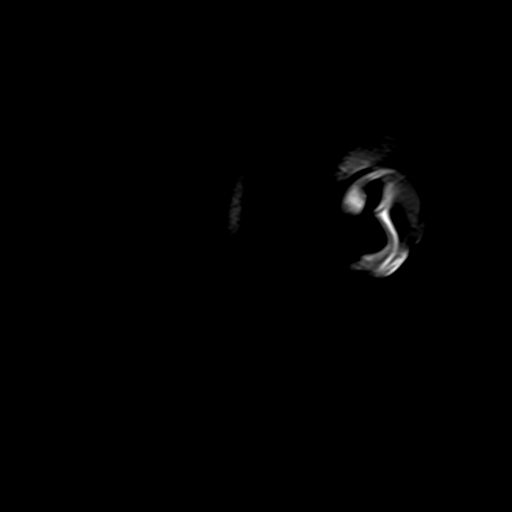

[Series 3: T2 · coronal · 3.0mm · 0.35mm/px · 5 of 44 slices shown]
[im 1/44]
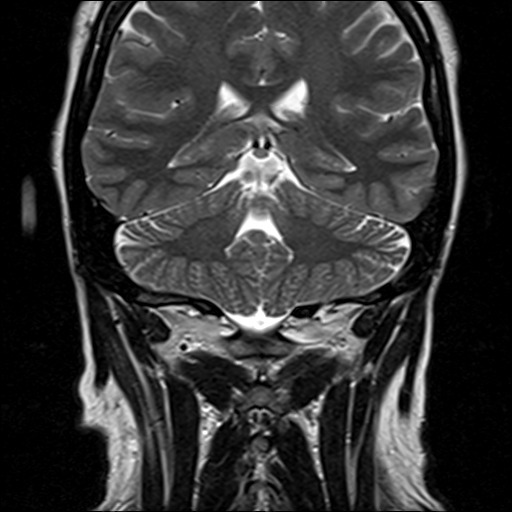
[im 11/44]
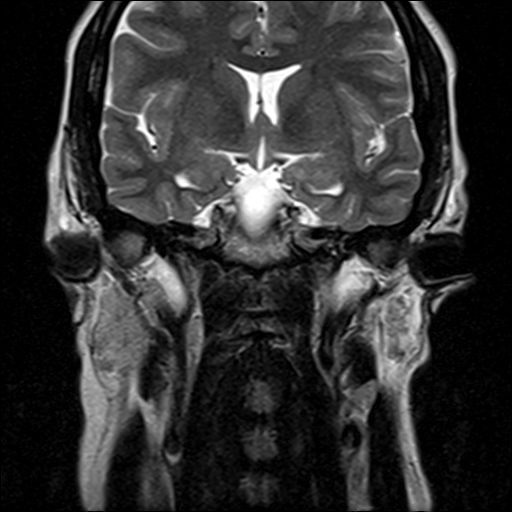
[im 22/44]
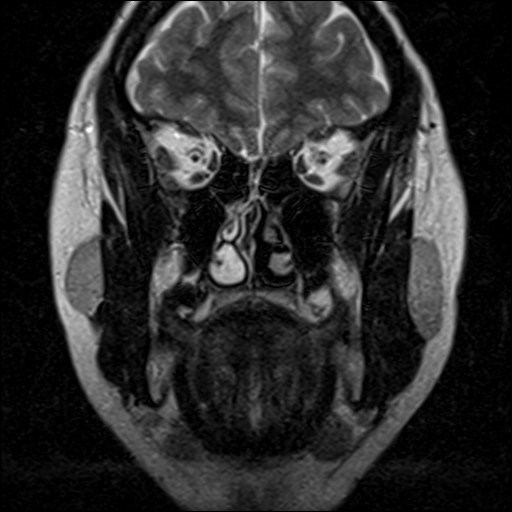
[im 33/44]
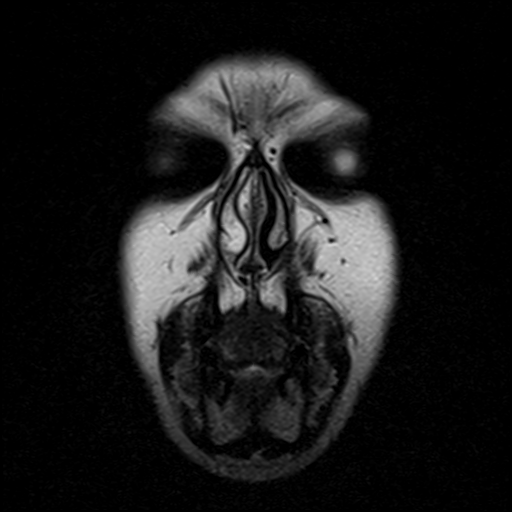
[im 44/44]
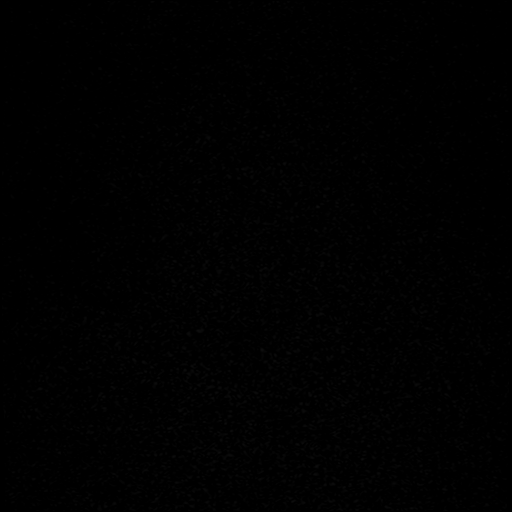

[Series 6: T1 · coronal · 3.0mm · 0.35mm/px · 6 of 44 slices shown (2 of 3)]
[im 1/44]
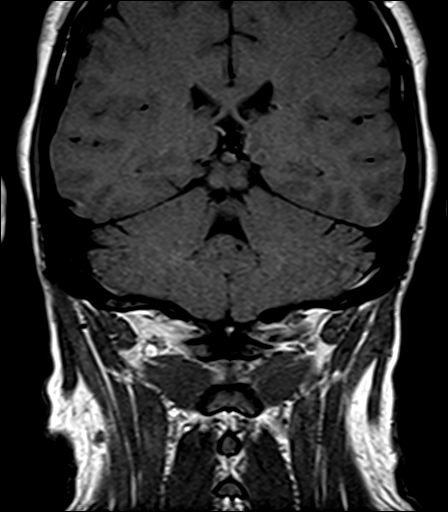
[im 9/44]
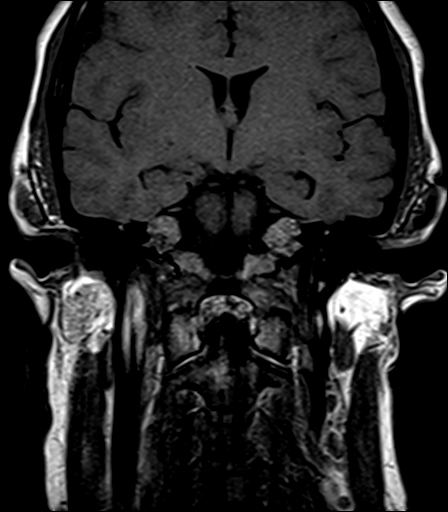
[im 18/44]
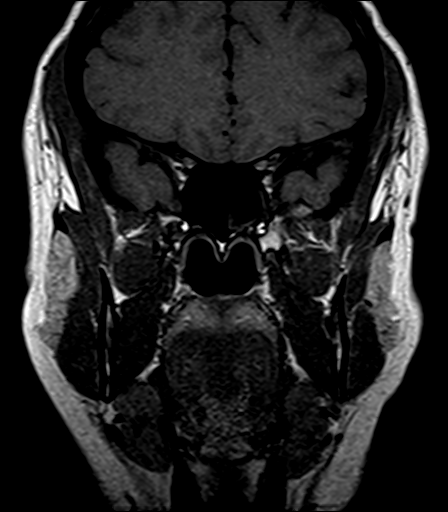
[im 26/44]
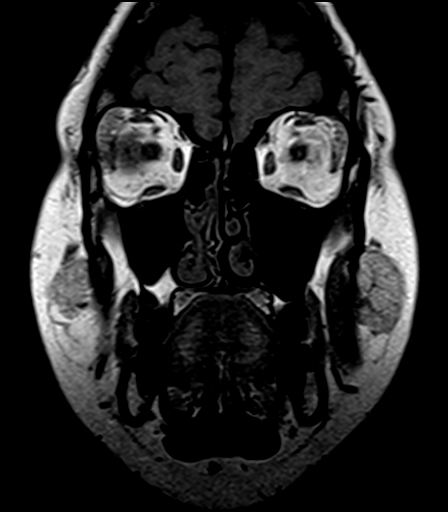
[im 35/44]
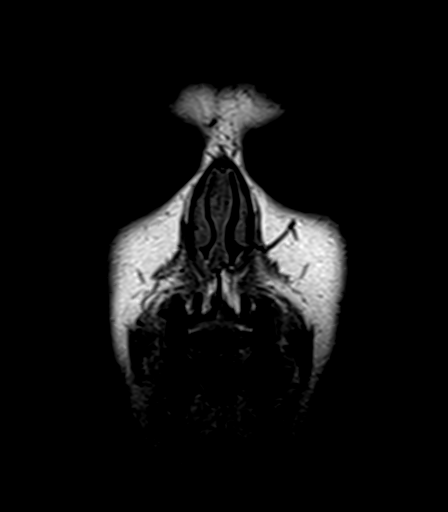
[im 44/44]
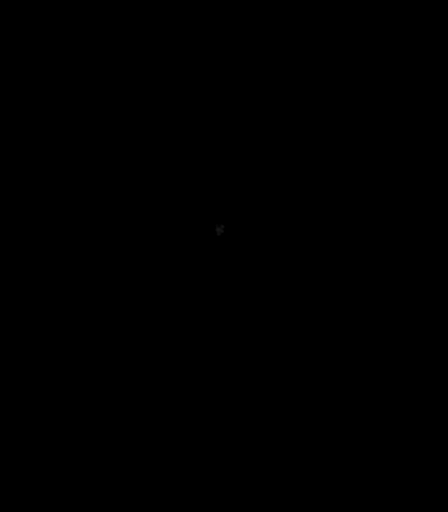

[Series 7: T1 · axial · 3.0mm · 0.50mm/px · z∈[-152,-7]mm · 6 of 45 slices shown (3 of 3)]
[im 1/45]
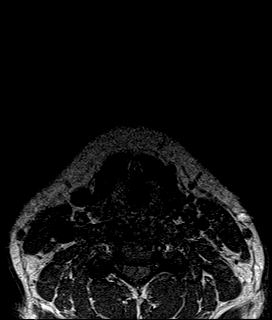
[im 9/45]
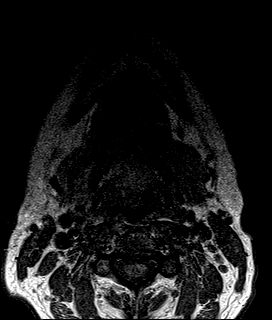
[im 18/45]
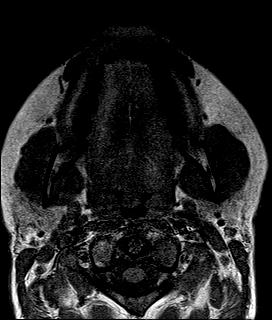
[im 27/45]
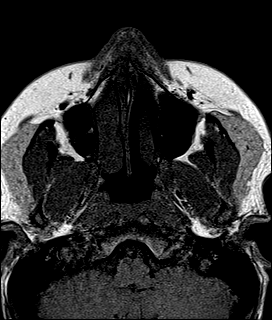
[im 36/45]
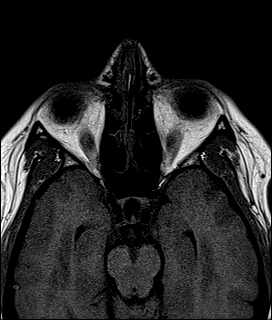
[im 45/45]
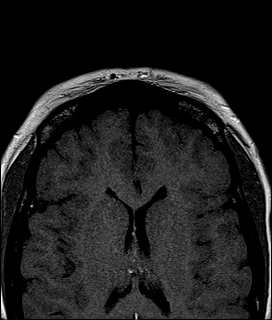

[Series 9: T1 fat-sat · coronal · 3.0mm · 0.35mm/px · 4 of 44 slices shown]
[im 1/44]
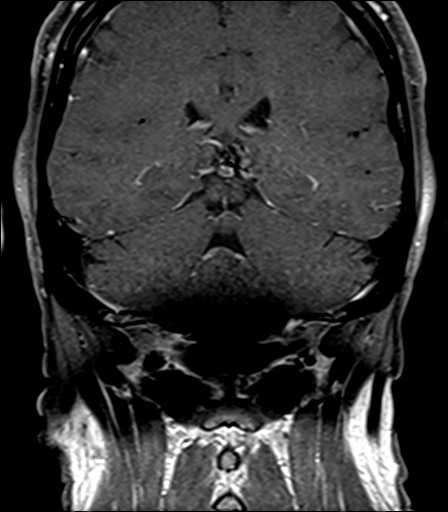
[im 9/44]
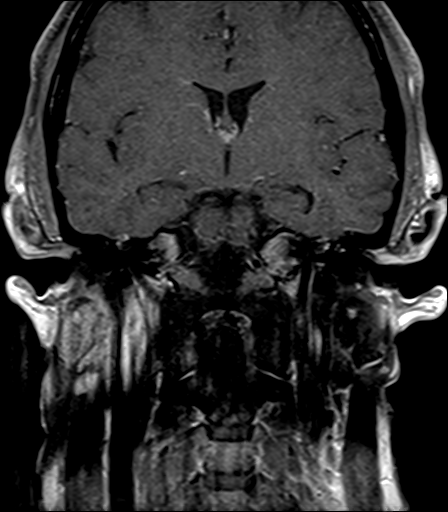
[im 18/44]
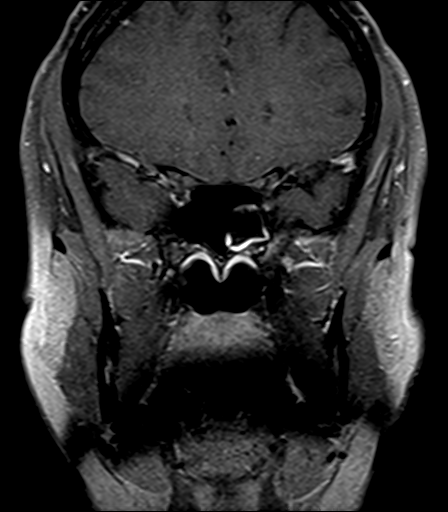
[im 26/44]
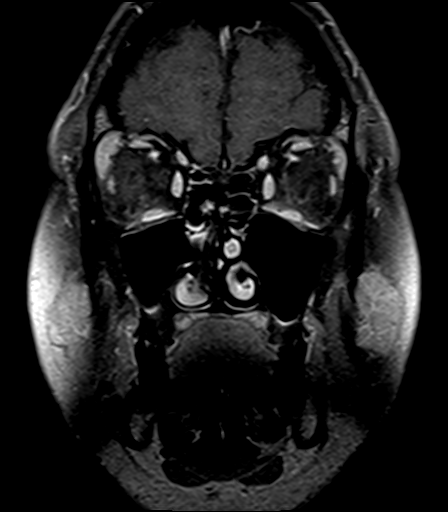

[25 of 48 positions shown; findings below may reference images not displayed]

FINDINGS: PARANASAL SINUSES/NASAL CAVITY: Mucosal thickening with fluid level
within the left sphenoid sinus. Remainder of the paranasal sinuses
are clear. Nasal cavity is unremarkable.

PERINEURAL ENHANCEMENT: None.

CAVERNOUS SINUSES: Unremarkable.

SKULL BASE: Normal marrow signal.

SOFT TISSUES/INFRATEMPORAL FOSSA: Unremarkable.

OTHER: Limited images of the visualized brain and orbits are normal.
No abnormal enhancement.
IMPRESSION: Mucosal thickening with fluid level within the left sphenoid sinus.
No obvious source of CSF fistula.

## 2020-09-29 MED ORDER — GADOBENATE DIMEGLUMINE 529 MG/ML IV SOLN
19.0000 mL | Freq: Once | INTRAVENOUS | Status: AC | PRN
  Administered 2020-09-29: 19 mL via INTRAVENOUS

## 2020-10-20 MED FILL — PHENTERMINE 37.5 MG TABLET: 37.5 | 30 days supply | Qty: 30 | Fill #2

## 2020-11-05 ENCOUNTER — Other Ambulatory Visit (HOSPITAL_COMMUNITY): Payer: Self-pay | Admitting: Family

## 2020-12-11 MED FILL — CLOPIDOGREL 75 MG TABLET: 75 | 60 days supply | Qty: 60 | Fill #0

## 2020-12-11 MED FILL — ASPIRIN EC 325 MG TABLET: 325 | 60 days supply | Qty: 60 | Fill #0

## 2021-01-23 ENCOUNTER — Encounter (HOSPITAL_COMMUNITY)
Admission: AD | Disposition: A | Discharge status: Home / Self Care | Payer: Self-pay | Source: Home / Self Care | Attending: Obstetrics and Gynecology

## 2021-01-23 ENCOUNTER — Encounter (HOSPITAL_COMMUNITY): Payer: Self-pay | Admitting: Obstetrics and Gynecology

## 2021-01-23 ENCOUNTER — Ambulatory Visit (HOSPITAL_COMMUNITY)
Admission: AD | Admit: 2021-01-23 | Discharge: 2021-01-24 | Disposition: A | Discharge status: Home / Self Care | Payer: No Typology Code available for payment source | Attending: Obstetrics and Gynecology | Admitting: Obstetrics and Gynecology

## 2021-01-23 ENCOUNTER — Encounter (HOSPITAL_COMMUNITY): Payer: Self-pay | Admitting: Anesthesiology

## 2021-01-23 ENCOUNTER — Inpatient Hospital Stay (HOSPITAL_COMMUNITY): Payer: No Typology Code available for payment source | Admitting: Anesthesiology

## 2021-01-23 ENCOUNTER — Inpatient Hospital Stay: Admit: 2021-01-23 | Payer: Self-pay | Admitting: Obstetrics and Gynecology

## 2021-01-23 DIAGNOSIS — Z95828 Presence of other vascular implants and grafts: Secondary | ICD-10-CM | POA: Diagnosis not present

## 2021-01-23 DIAGNOSIS — O021 Missed abortion: Principal | ICD-10-CM | POA: Insufficient documentation

## 2021-01-23 DIAGNOSIS — Z20822 Contact with and (suspected) exposure to covid-19: Secondary | ICD-10-CM | POA: Diagnosis not present

## 2021-01-23 DIAGNOSIS — G932 Benign intracranial hypertension: Secondary | ICD-10-CM | POA: Diagnosis not present

## 2021-01-23 DIAGNOSIS — F17210 Nicotine dependence, cigarettes, uncomplicated: Secondary | ICD-10-CM | POA: Diagnosis not present

## 2021-01-23 DIAGNOSIS — O081 Delayed or excessive hemorrhage following ectopic and molar pregnancy: Secondary | ICD-10-CM | POA: Insufficient documentation

## 2021-01-23 HISTORY — DX: Benign intracranial hypertension: G93.2

## 2021-01-23 HISTORY — PX: DILATION AND EVACUATION: SHX1459

## 2021-01-23 LAB — RESP PANEL BY RT-PCR (FLU A&B, COVID) ARPGX2
Influenza A by PCR: NEGATIVE
Influenza B by PCR: NEGATIVE
SARS Coronavirus 2 by RT PCR: NEGATIVE

## 2021-01-23 LAB — TYPE AND SCREEN
ABO/RH(D): A POS
Antibody Screen: NEGATIVE

## 2021-01-23 LAB — CBC
HCT: 37.4 % (ref 36.0–46.0)
Hemoglobin: 12.2 g/dL (ref 12.0–15.0)
MCH: 29.4 pg (ref 26.0–34.0)
MCHC: 32.6 g/dL (ref 30.0–36.0)
MCV: 90.1 fL (ref 80.0–100.0)
Platelets: 393 10*3/uL (ref 150–400)
RBC: 4.15 MIL/uL (ref 3.87–5.11)
RDW: 13.9 % (ref 11.5–15.5)
WBC: 14.7 10*3/uL — ABNORMAL HIGH (ref 4.0–10.5)
nRBC: 0 % (ref 0.0–0.2)

## 2021-01-23 SURGERY — DILATION AND CURETTAGE
Anesthesia: General

## 2021-01-23 SURGERY — DILATION AND EVACUATION, UTERUS
Anesthesia: General

## 2021-01-23 MED ORDER — LIDOCAINE HCL (CARDIAC) PF 100 MG/5ML IV SOSY
PREFILLED_SYRINGE | INTRAVENOUS | Status: DC | PRN
  Administered 2021-01-23: 60 mg via INTRATRACHEAL

## 2021-01-23 MED ORDER — KETOROLAC TROMETHAMINE 30 MG/ML IJ SOLN
INTRAMUSCULAR | Status: AC
  Filled 2021-01-23: qty 1

## 2021-01-23 MED ORDER — EPHEDRINE SULFATE 50 MG/ML IJ SOLN
INTRAMUSCULAR | Status: DC | PRN
  Administered 2021-01-23: 5 mg via INTRAVENOUS

## 2021-01-23 MED ORDER — FENTANYL CITRATE (PF) 250 MCG/5ML IJ SOLN
INTRAMUSCULAR | Status: AC
  Filled 2021-01-23: qty 5

## 2021-01-23 MED ORDER — DOXYCYCLINE HYCLATE 100 MG IV SOLR
200.0000 mg | Freq: Two times a day (BID) | INTRAVENOUS | Status: DC
  Administered 2021-01-23: 200 mg via INTRAVENOUS
  Filled 2021-01-23 (×3): qty 200

## 2021-01-23 MED ORDER — SCOPOLAMINE 1 MG/3DAYS TD PT72
MEDICATED_PATCH | TRANSDERMAL | Status: AC
  Filled 2021-01-23: qty 1

## 2021-01-23 MED ORDER — HYDROMORPHONE HCL 1 MG/ML IJ SOLN: 0.2500 mg | INTRAMUSCULAR | Status: DC | PRN

## 2021-01-23 MED ORDER — ONDANSETRON HCL 4 MG/2ML IJ SOLN
INTRAMUSCULAR | Status: AC
  Filled 2021-01-23: qty 2

## 2021-01-23 MED ORDER — ROCURONIUM BROMIDE 10 MG/ML (PF) SYRINGE
PREFILLED_SYRINGE | INTRAVENOUS | Status: AC
  Filled 2021-01-23: qty 10

## 2021-01-23 MED ORDER — 0.9 % SODIUM CHLORIDE (POUR BTL) OPTIME
TOPICAL | Status: DC | PRN
  Administered 2021-01-23: 1000 mL

## 2021-01-23 MED ORDER — EPHEDRINE 5 MG/ML INJ
INTRAVENOUS | Status: AC
  Filled 2021-01-23: qty 10

## 2021-01-23 MED ORDER — LACTATED RINGERS IV BOLUS
1000.0000 mL | Freq: Once | INTRAVENOUS | Status: AC
  Administered 2021-01-23: 1000 mL via INTRAVENOUS

## 2021-01-23 MED ORDER — ARTIFICIAL TEARS OPHTHALMIC OINT
TOPICAL_OINTMENT | OPHTHALMIC | Status: AC
  Filled 2021-01-23: qty 3.5

## 2021-01-23 MED ORDER — MISOPROSTOL 200 MCG PO TABS
ORAL_TABLET | ORAL | Status: AC
  Filled 2021-01-23: qty 4

## 2021-01-23 MED ORDER — BUPIVACAINE HCL (PF) 0.25 % IJ SOLN
INTRAMUSCULAR | Status: AC
  Filled 2021-01-23: qty 30

## 2021-01-23 MED ORDER — POVIDONE-IODINE 10 % EX SWAB: 2.0000 "application " | Freq: Once | CUTANEOUS | Status: DC

## 2021-01-23 MED ORDER — OXYTOCIN-SODIUM CHLORIDE 30-0.9 UT/500ML-% IV SOLN
INTRAVENOUS | Status: AC
  Filled 2021-01-23: qty 500

## 2021-01-23 MED ORDER — SCOPOLAMINE 1 MG/3DAYS TD PT72
MEDICATED_PATCH | TRANSDERMAL | Status: DC | PRN
  Administered 2021-01-23: 1 via TRANSDERMAL

## 2021-01-23 MED ORDER — DEXAMETHASONE SODIUM PHOSPHATE 10 MG/ML IJ SOLN
INTRAMUSCULAR | Status: AC
  Filled 2021-01-23: qty 1

## 2021-01-23 MED ORDER — SUCCINYLCHOLINE CHLORIDE 20 MG/ML IJ SOLN
INTRAMUSCULAR | Status: DC | PRN
  Administered 2021-01-23: 120 mg via INTRAVENOUS

## 2021-01-23 MED ORDER — KETOROLAC TROMETHAMINE 30 MG/ML IJ SOLN
INTRAMUSCULAR | Status: DC | PRN
  Administered 2021-01-23: 30 mg via INTRAVENOUS

## 2021-01-23 MED ORDER — PHENYLEPHRINE 40 MCG/ML (10ML) SYRINGE FOR IV PUSH (FOR BLOOD PRESSURE SUPPORT)
PREFILLED_SYRINGE | INTRAVENOUS | Status: AC
  Filled 2021-01-23: qty 10

## 2021-01-23 MED ORDER — LACTATED RINGERS IV SOLN: INTRAVENOUS | Status: DC | PRN

## 2021-01-23 MED ORDER — LIDOCAINE 2% (20 MG/ML) 5 ML SYRINGE
INTRAMUSCULAR | Status: AC
  Filled 2021-01-23: qty 5

## 2021-01-23 MED ORDER — SUCCINYLCHOLINE CHLORIDE 200 MG/10ML IV SOSY
PREFILLED_SYRINGE | INTRAVENOUS | Status: AC
  Filled 2021-01-23: qty 10

## 2021-01-23 MED ORDER — SODIUM CHLORIDE (PF) 0.9 % IJ SOLN
INTRAMUSCULAR | Status: AC
  Filled 2021-01-23: qty 10

## 2021-01-23 MED ORDER — BUPIVACAINE HCL 0.25 % IJ SOLN
INTRAMUSCULAR | Status: DC | PRN
  Administered 2021-01-23: 10 mL

## 2021-01-23 MED ORDER — MIDAZOLAM HCL 5 MG/5ML IJ SOLN
INTRAMUSCULAR | Status: DC | PRN
  Administered 2021-01-23: 2 mg via INTRAVENOUS

## 2021-01-23 MED ORDER — MIDAZOLAM HCL 2 MG/2ML IJ SOLN
INTRAMUSCULAR | Status: AC
  Filled 2021-01-23: qty 2

## 2021-01-23 MED ORDER — FENTANYL CITRATE (PF) 250 MCG/5ML IJ SOLN
INTRAMUSCULAR | Status: DC | PRN
  Administered 2021-01-23: 50 ug via INTRAVENOUS
  Administered 2021-01-23: 100 ug via INTRAVENOUS

## 2021-01-23 MED ORDER — DEXAMETHASONE SODIUM PHOSPHATE 10 MG/ML IJ SOLN
INTRAMUSCULAR | Status: DC | PRN
  Administered 2021-01-23: 10 mg via INTRAVENOUS

## 2021-01-23 MED ORDER — ONDANSETRON HCL 4 MG/2ML IJ SOLN
INTRAMUSCULAR | Status: DC | PRN
  Administered 2021-01-23: 4 mg via INTRAVENOUS

## 2021-01-23 MED ORDER — PROPOFOL 10 MG/ML IV BOLUS
INTRAVENOUS | Status: AC
  Filled 2021-01-23: qty 20

## 2021-01-23 MED ORDER — PROPOFOL 10 MG/ML IV BOLUS
INTRAVENOUS | Status: DC | PRN
  Administered 2021-01-23: 170 mg via INTRAVENOUS

## 2021-01-23 SURGICAL SUPPLY — 23 items
CATH ROBINSON RED A/P 16FR (CATHETERS) ×4 IMPLANT
CNTNR URN SCR LID CUP LEK RST (MISCELLANEOUS) ×1 IMPLANT
CONT SPEC 4OZ STRL OR WHT (MISCELLANEOUS) ×1
DECANTER SPIKE VIAL GLASS SM (MISCELLANEOUS) ×2 IMPLANT
FILTER UTR ASPR ASSEMBLY (MISCELLANEOUS) ×2 IMPLANT
GLOVE BIO SURGEON STRL SZ7.5 (GLOVE) ×2 IMPLANT
GLOVE ECLIPSE 6.5 STRL STRAW (GLOVE) ×2 IMPLANT
GLOVE SURG UNDER POLY LF SZ7 (GLOVE) ×6 IMPLANT
GOWN STRL REUS W/ TWL LRG LVL3 (GOWN DISPOSABLE) ×4 IMPLANT
GOWN STRL REUS W/TWL LRG LVL3 (GOWN DISPOSABLE) ×8
KIT BERKELEY 1ST TRI 3/8 NO TR (MISCELLANEOUS) ×2 IMPLANT
KIT BERKELEY 1ST TRIMESTER 3/8 (MISCELLANEOUS) ×2 IMPLANT
KIT TURNOVER KIT B (KITS) ×2 IMPLANT
NS IRRIG 1000ML POUR BTL (IV SOLUTION) ×4 IMPLANT
PACK VAGINAL MINOR WOMEN LF (CUSTOM PROCEDURE TRAY) ×4 IMPLANT
PAD OB MATERNITY 4.3X12.25 (PERSONAL CARE ITEMS) ×4 IMPLANT
SET BERKELEY SUCTION TUBING (SUCTIONS) ×2 IMPLANT
TOWEL GREEN STERILE FF (TOWEL DISPOSABLE) ×8 IMPLANT
UNDERPAD 30X36 HEAVY ABSORB (UNDERPADS AND DIAPERS) ×4 IMPLANT
VACURETTE 10 RIGID CVD (CANNULA) IMPLANT
VACURETTE 7MM CVD STRL WRAP (CANNULA) ×2 IMPLANT
VACURETTE 8 RIGID CVD (CANNULA) IMPLANT
VACURETTE 9 RIGID CVD (CANNULA) IMPLANT

## 2021-01-23 NOTE — H&P (Signed)
Paula Burgess is a 35 y.o. female [phone] with MAB presents to MAU with active hemorrhage and syncopal episode. Patient was promptly evaluated by MAU provider Dr. Nelda Marseille, please see MAU note for details. Patient had confirmed MAB measuring [redacted]w[redacted]d with empty GS, absence of FP and YS on 01/19/21 after initial [place] On 01/05/21 showed similar findings. Patient elected for expectant management at that time with office follow up. She called the answering service this evening, reported continuous heavy bleeding on the toilet for 2 hours. Previously had lighter bleeding for the past 1 week. Denies any pain or cramping. Reports history of anemia but had not been dizzy or light headed until arrival to MAU.  Obstetric history significant for two prior full term deliveries by cesarean section Medical history significant for ICH and is s/p stent placement this year. Patient had been on Plavix for VTE ppx after the stent placement which she stopped April 1st (15 days prior). She does take a baby Aspirin when she remembers.  No other abdominal/pelvic surgeries No other daily medications  This was not a planned pregnancy and patient does not have plans for future pregnancies   OB History    Gravida  3   Para  2   Term  2   Preterm      AB      Living  2     SAB      IAB      Ectopic      Multiple      Live Births  1          Past Medical History:  Diagnosis Date  . GERD (gastroesophageal reflux disease)   . Intracranial hypertension    Past Surgical History:  Procedure Laterality Date  . ceribrial stent    . CESAREAN SECTION  2012  . CESAREAN SECTION N/A 02/12/2014   Procedure: Repeat CESAREAN SECTION;  Surgeon: Lovenia Kim, MD;  Location: [facility];  Service: Obstetrics;  Laterality: N/A;   Family History: family history includes Diabetes in her maternal grandfather, maternal grandmother, paternal grandfather, and paternal grandmother; Healthy in her father and mother. Social History:   reports that she has been smoking cigarettes. She started smoking about 16 years ago. She has a 7.50 pack-year smoking history. She has never used smokeless tobacco. She reports that she does not drink alcohol and does not use drugs.       Review of Systems  All other systems reviewed and are negative.  Per HPI Maternal Exam:  Introitus: Normal vulva.   Physical Exam Vitals reviewed.  Constitutional:      Appearance: She is diaphoretic.     Comments: pale  HENT:     Head: Normocephalic.  Cardiovascular:     Rate and Rhythm: Tachycardia present.  Pulmonary:     Effort: Pulmonary effort is normal.  Abdominal:     General: Abdomen is flat.     Palpations: Abdomen is soft.  Genitourinary:    General: Normal vulva.     Comments: 1/4 saturated pad current Musculoskeletal:        General: Normal range of motion.     Cervical back: Normal range of motion.  Skin:    General: Skin is warm.     Coloration: Skin is pale.  Neurological:     General: No focal deficit present.     Mental Status: She is alert.  Psychiatric:        Mood and Affect: Mood normal.  Behavior: Behavior normal.       Blood pressure 113/76, pulse 96, temperature 98.2 F (36.8 C), resp. rate 20, last menstrual period 11/17/2020, SpO2 100 %.  Hematology Recent Labs  Lab 01/23/21 2003  WBC 14.7*  RBC 4.15  HGB 12.2  HCT 37.4  MCV 90.1  MCH 29.4  MCHC 32.6  RDW 13.9  PLT 393    Assessment/Plan: Paula Burgess is a 35 y.o. female [phone] with active heavy vaginal bleeding in the setting of MAB, consented for surgical management with D&C procedure  Patient seen and evaluated at the bedside in MAU. We discussed risks/benefits of D&C including risks of additional bleeding, infection, damage to surrounding organs, and uterine adhesions impacting future pregnancies. She is agreeable to blood transfusion if indicated.  -Main OR previously notified -NPO -SCD VTE ppx -Doxycycline IV abx  ppx, NKDA -Hgb stable 12.2 likely has not equilibrated and consider repeat post op -Type and Screen pend -COVID screen pend -Discharge pending intraop course  Damyn Weitzel A Gamaliel Charney 01/23/2021, 8:46 PM

## 2021-01-23 NOTE — Transfer of Care (Signed)
Immediate Anesthesia Transfer of Care Note  Patient: Paula Burgess  Procedure(s) Performed: DILATATION AND EVACUATION (N/A )  Patient Location: PACU  Anesthesia Type:General  Level of Consciousness: awake  Airway & Oxygen Therapy: Patient Spontanous Breathing  Post-op Assessment: Report given to RN and Post -op Vital signs reviewed and stable  Post vital signs: Reviewed and stable  Last Vitals:  Vitals Value Taken Time  BP 122/51 01/23/21 2300  Temp 35.7 C 01/23/21 2245  Pulse 110 01/23/21 2305  Resp 11 01/23/21 2305  SpO2 98 % 01/23/21 2305  Vitals shown include unvalidated device data.  Last Pain:  Vitals:   01/23/21 2300  PainSc: 0-No pain         Complications: No complications documented.

## 2021-01-23 NOTE — Anesthesia Postprocedure Evaluation (Signed)
Anesthesia Post Note  Patient: Paula Burgess  Procedure(s) Performed: DILATATION AND EVACUATION (N/A )     Patient location during evaluation: PACU Anesthesia Type: General Level of consciousness: awake and alert Pain management: pain level controlled Vital Signs Assessment: post-procedure vital signs reviewed and stable Respiratory status: spontaneous breathing, nonlabored ventilation and respiratory function stable Cardiovascular status: blood pressure returned to baseline and stable Postop Assessment: no apparent nausea or vomiting Anesthetic complications: no   No complications documented.  Last Vitals:  Vitals:   01/23/21 2315 01/23/21 2330  BP: 116/83 93/64  Pulse: (!) 117 (!) 106  Resp: 14 13  Temp: 36.4 C 37.1 C  SpO2: 100% 100%    Last Pain:  Vitals:   01/23/21 2330  PainSc: 0-No pain                 Alana Dayton,W. EDMOND

## 2021-01-23 NOTE — MAU Note (Signed)
Pt in lobby non responsive and possibly having a seizure. Assisted pt to room . Large gush of blood on floor. Pt more responsive. Vomited when she got in room and became non responsive again (vagal) Provider in room pt more responsive  Another large gush of vag bleeding noted. Assisted pt to bed. Pt able to answer questions. Stated she had a miscarriage last week and has ben having bleeding. Bleeding got heavier around 4pm. Denies pain or cramping.

## 2021-01-23 NOTE — MAU Note (Signed)
OBSTETRIC ADMISSION HISTORY AND PHYSICAL  Paula Burgess is a 35 y.o. female G78P2002 with [redacted]w[redacted]d missedAB- seen at [facility], Dr Ronita Hipps.  Pt notes that she has been bleeding since 4pm reporting she has been on the toilet the entire time.  Denies pelvic or abdominal pain.  She had been followed in the office with [place], Rx for medication was given, but she had not taken this.  Prenatal History/Complications: prior C-section x 2 -notes h/o anemia  Past Medical History: Past Medical History:  Diagnosis Date  . GERD (gastroesophageal reflux disease)     Past Surgical History: Past Surgical History:  Procedure Laterality Date  . CESAREAN SECTION  2012  . CESAREAN SECTION N/A 02/12/2014   Procedure: Repeat CESAREAN SECTION;  Surgeon: Lovenia Kim, MD;  Location: [facility];  Service: Obstetrics;  Laterality: N/A;    Obstetrical History: OB History    Gravida  3   Para  2   Term  2   Preterm      AB      Living  2     SAB      IAB      Ectopic      Multiple      Live Births  1           Social History Social History   Socioeconomic History  . Marital status: Divorced    Spouse name: Not on file  . Number of children: 2  . Years of education: Not on file  . Highest education level: Associate degree: occupational, Hotel manager, or vocational program  Occupational History  . Not on file  Tobacco Use  . Smoking status: Current Every Day Smoker    Packs/day: 0.50    Years: 15.00    Pack years: 7.50    Types: Cigarettes    Start date: 2006  . Smokeless tobacco: Never Used  Vaping Use  . Vaping Use: Never used  Substance and Sexual Activity  . Alcohol use: No  . Drug use: No  . Sexual activity: Not on file  Other Topics Concern  . Not on file  Social History Narrative   Lives with her children   Right handed   Caffeine: maybe 3 cups/day   Social Determinants of Health   Financial Resource Strain: Not on file  Food Insecurity: Not on file   Transportation Needs: Not on file  Physical Activity: Not on file  Stress: Not on file  Social Connections: Not on file    Family History: Family History  Problem Relation Age of Onset  . Healthy Mother   . Healthy Father   . Diabetes Maternal Grandmother   . Diabetes Maternal Grandfather   . Diabetes Paternal Grandmother   . Diabetes Paternal Grandfather   . Migraines Neg Hx   . Pseudotumor cerebri Neg Hx     Allergies: No Known Allergies  Medications Prior to Admission  Medication Sig Dispense Refill Last Dose  . acetaZOLAMIDE (DIAMOX) 250 MG tablet Take 1 tablet (250 mg total) by mouth 2 (two) times daily. 180 tablet 3   . aspirin EC 325 MG tablet TAKE 1 TABLET BY MOUTH ONCE A DAY 60 tablet 0   . azithromycin (ZITHROMAX) 250 MG tablet Take 1 tablet (250 mg total) by mouth daily. Take first 2 tablets together, then 1 every day until finished. 6 tablet 0   . clopidogrel (PLAVIX) 75 MG tablet TAKE 1 TABLET BY MOUTH ONCE A DAY (STOP ON 02/09/21) 60  tablet 0   . fluticasone (FLONASE) 50 MCG/ACT nasal spray Place 1 spray into both nostrils daily for 14 days. 16 g 0   . norethindrone (MICRONOR) 0.35 MG tablet TAKE 1 TABLET BY MOUTH ONCE DAILY 28 tablet 6   . phentermine (ADIPEX-P) 37.5 MG tablet Take 1 tablet by mouth daily.     . phentermine (ADIPEX-P) 37.5 MG tablet TAKE 1 TABLET BY MOUTH EVERY MORNING 30 tablet 2      Review of Systems   All systems reviewed and negative except as stated in HPI  Blood pressure 133/66, pulse (!) 115, temperature 98.2 F (36.8 C), resp. rate 20, last menstrual period 11/17/2020. General appearance: alert cooperative, mild distress, +diaphoretic, pallor noted Lungs: clear to auscultation bilaterally Heart: +tachycardia Abdomen: soft, non-tender; bowel sounds normal Pelvic: large soaked pad noted Extremities: Homans sign is negative, no sign of DVT    No results found for this or any previous visit (from the past 24 hour(s)).  Patient  Active Problem List   Diagnosis Date Noted  . IIH (idiopathic intracranial hypertension) 12/10/2019  . Papilledema 12/10/2019  . Postpartum care following cesarean delivery (5/6) 02/13/2014  . Cesarean delivery delivered 02/12/2014    Assessment/Plan:  Paula Burgess is a 35 y.o. G3P2002 at [redacted]w[redacted]d with missed AB now with active bleeding -NPO -LR bolus then @125cc /hr -CBC, T&S ordered -Due to continued bleeding, discussed plan for surgical intervention- Dr. Lanny Cramp consulted.  Agreeable to plan for D&E due to missed AB -IV Doxy to [place], DO  01/23/2021, 8:11 PM

## 2021-01-23 NOTE — MAU Note (Signed)
Pt passed another large clot > weighed 200cc.

## 2021-01-23 NOTE — Op Note (Signed)
Paula Burgess December 03, 1985 [id number]   Operative Note  PROCEDURE: dilation and curettage   PRE-OPERATIVE DIAGNOSIS: missed abortion, active bleeding  POST-OPERATIVE DIAGNOSIS: missed abortion, active bleeding  SURGEON: Dr. Langley Gauss, DO  ASSISTANT: N/A  FINDINGS: normal appearing external female genitalia, ectocervix normal without lesions, cervix dilated to a 31 french  without difficulties  SPECIMENS: products of conception  EBL: 10 cc  FLUIDS: per anesthesia  MEDICATIONS: Doxycycline 893 mg IV  COMPLICATIONS: None  PROCEDURE IN DETAIL:   After the patient was appropriately consented in the holding area, she was taken to the operating room where general anesthesia was administered without complications. The patient was placed in the dorsal lithotomy position. The patient was prepped and draped in the usual sterile fashion. An appropriate time out was performed that verified the correct patient, procedure, and surgical team.   A sterile speculum was inserted into the vagina and the cervix was visualized. A single tooth tenaculum was used to grasp the anterior lip of the cervix. A paracervical block was obtained using .025% Marcaine injecting a total of 10 cc divided between the 4 and 8 o'clock positions. The cervix was easily dilated to a size 31 french without difficulties. A size 7 mm suction curette was gently advanced to the fundus and connected to the suction tubing. Once the appropriate pressure was obtained the curette was rotated clockwise and slowly withdrawn from the uterine cavity. Specimens within the tubing appeared consistent with products of conception. Two passes were made followed by a gentle sharp curetting and one more pass with the suction curette. On the final pass only blood was noted within the tubing. Minimal bleeding from the external os. All specimens were sent together for final pathology. The tenaculum was removed and excellent hemostasis was  appreciated. Speculum was removed. The bladder was drained with an in and out catheter for 75 cc clear urine. Patient tolerated the procedure well and was taken to the recovery room in stable condition. All instrument and lap counts were correct. Patient blood type A POS. A postoperative CBC will be obtained in the PACU given the EBL of 500 cc prior to OR.   [name] 01/23/21 10:34 PM

## 2021-01-23 NOTE — Anesthesia Procedure Notes (Signed)
Procedure Name: Intubation Date/Time: 01/23/2021 10:16 PM Performed by: Clovis Cao, CRNA Pre-anesthesia Checklist: Patient identified, Emergency Drugs available, Suction available, Patient being monitored and Timeout performed Patient Re-evaluated:Patient Re-evaluated prior to induction Oxygen Delivery Method: Circle system utilized Preoxygenation: Pre-oxygenation with 100% oxygen Induction Type: IV induction, Rapid sequence and Cricoid Pressure applied Laryngoscope Size: Miller and 2 Grade View: Grade I Tube type: Oral Tube size: 7.0 mm Number of attempts: 1 Airway Equipment and Method: Stylet Placement Confirmation: ETT inserted through vocal cords under direct vision,  positive ETCO2 and breath sounds checked- equal and bilateral Secured at: 21 cm Tube secured with: Tape Dental Injury: Teeth and Oropharynx as per pre-operative assessment

## 2021-01-23 NOTE — Anesthesia Preprocedure Evaluation (Addendum)
Anesthesia Evaluation  Patient identified by MRN, date of birth, ID band Patient awake    Reviewed: Allergy & Precautions, H&P , NPO status , Patient's Chart, lab work & pertinent test results  Airway Mallampati: II  TM Distance: >3 FB Neck ROM: Full    Dental no notable dental hx. (+) Teeth Intact, Dental Advisory Given   Pulmonary Current SmokerPatient did not abstain from smoking.,    Pulmonary exam normal breath sounds clear to auscultation       Cardiovascular negative cardio ROS   Rhythm:Regular Rate:Normal     Neuro/Psych negative neurological ROS  negative psych ROS   GI/Hepatic Neg liver ROS, GERD  Medicated,  Endo/Other  negative endocrine ROS  Renal/GU negative Renal ROS  negative genitourinary   Musculoskeletal   Abdominal   Peds  Hematology negative hematology ROS (+)   Anesthesia Other Findings   Reproductive/Obstetrics negative OB ROS                            Anesthesia Physical Anesthesia Plan  ASA: II and emergent  Anesthesia Plan: General   Post-op Pain Management:    Induction: Intravenous, Rapid sequence and Cricoid pressure planned  PONV Risk Score and Plan: 3 and Ondansetron, Dexamethasone and Midazolam  Airway Management Planned: Oral ETT  Additional Equipment:   Intra-op Plan:   Post-operative Plan: Extubation in OR  Informed Consent: I have reviewed the patients History and Physical, chart, labs and discussed the procedure including the risks, benefits and alternatives for the proposed anesthesia with the patient or authorized representative who has indicated his/her understanding and acceptance.     Dental advisory given  Plan Discussed with: CRNA  Anesthesia Plan Comments:         Anesthesia Quick Evaluation

## 2021-01-24 ENCOUNTER — Encounter (HOSPITAL_COMMUNITY): Payer: Self-pay | Admitting: Obstetrics and Gynecology

## 2021-01-24 LAB — CBC WITH DIFFERENTIAL/PLATELET
Abs Immature Granulocytes: 0.06 10*3/uL (ref 0.00–0.07)
Basophils Absolute: 0 10*3/uL (ref 0.0–0.1)
Basophils Relative: 0 %
Eosinophils Absolute: 0 10*3/uL (ref 0.0–0.5)
Eosinophils Relative: 0 %
HCT: 31.2 % — ABNORMAL LOW (ref 36.0–46.0)
Hemoglobin: 10.3 g/dL — ABNORMAL LOW (ref 12.0–15.0)
Immature Granulocytes: 1 %
Lymphocytes Relative: 9 %
Lymphs Abs: 1.1 10*3/uL (ref 0.7–4.0)
MCH: 30.1 pg (ref 26.0–34.0)
MCHC: 33 g/dL (ref 30.0–36.0)
MCV: 91.2 fL (ref 80.0–100.0)
Monocytes Absolute: 0.1 10*3/uL (ref 0.1–1.0)
Monocytes Relative: 1 %
Neutro Abs: 11.5 10*3/uL — ABNORMAL HIGH (ref 1.7–7.7)
Neutrophils Relative %: 89 %
Platelets: 283 10*3/uL (ref 150–400)
RBC: 3.42 MIL/uL — ABNORMAL LOW (ref 3.87–5.11)
RDW: 13.9 % (ref 11.5–15.5)
WBC: 12.9 10*3/uL — ABNORMAL HIGH (ref 4.0–10.5)
nRBC: 0 % (ref 0.0–0.2)

## 2021-01-26 LAB — SURGICAL PATHOLOGY

## 2021-01-27 ENCOUNTER — Encounter (HOSPITAL_COMMUNITY): Payer: Self-pay | Admitting: Obstetrics and Gynecology

## 2021-02-08 ENCOUNTER — Other Ambulatory Visit (HOSPITAL_COMMUNITY): Payer: Self-pay

## 2021-02-08 MED ORDER — PHENTERMINE HCL 37.5 MG PO TABS
ORAL_TABLET | ORAL | 2 refills | Status: DC
  Filled 2021-02-08: qty 30, 30d supply, fill #0
  Filled 2021-03-22: qty 30, 30d supply, fill #1
  Filled 2021-04-27: qty 30, 30d supply, fill #2

## 2021-03-22 ENCOUNTER — Other Ambulatory Visit (HOSPITAL_COMMUNITY): Payer: Self-pay

## 2021-04-26 ENCOUNTER — Telehealth: Payer: No Typology Code available for payment source | Admitting: Physician Assistant

## 2021-04-26 DIAGNOSIS — B360 Pityriasis versicolor: Secondary | ICD-10-CM | POA: Diagnosis not present

## 2021-04-26 MED ORDER — KETOCONAZOLE 2 % EX SHAM
1.0000 "application " | MEDICATED_SHAMPOO | CUTANEOUS | 0 refills | Status: DC
  Filled 2021-04-26: qty 120, 30d supply, fill #0

## 2021-04-26 NOTE — Progress Notes (Signed)
E Visit for Rash  We are sorry that you are not feeling well. Here is how we plan to help!  Based upon your presentation it appears you have a fungal infection.  I have prescribed: Ketoconazole shampoo Use as a body wash on affected area 2 times weekly.  Once rash clears discontinue use. Also once rash clears you can wash your body with Selsum blue shampoo. This will prevent recurrence.   The fungal infection is called tinea versicolor.  HOME CARE:  Take cool showers and avoid direct sunlight. Apply cool compress or wet dressings. Take a bath in an oatmeal bath.  Sprinkle content of one Aveeno packet under running faucet with comfortably warm water.  Bathe for 15-20 minutes, 1-2 times daily.  Pat dry with a towel. Do not rub the rash. Use hydrocortisone cream. Take an antihistamine like Benadryl for widespread rashes that itch.  The adult dose of Benadryl is 25-50 mg by mouth 4 times daily. Caution:  This type of medication may cause sleepiness.  Do not drink alcohol, drive, or operate dangerous machinery while taking antihistamines.  Do not take these medications if you have prostate enlargement.  Read package instructions thoroughly on all medications that you take.  GET HELP RIGHT AWAY IF:  Symptoms don't go away after treatment. Severe itching that persists. If you rash spreads or swells. If you rash begins to smell. If it blisters and opens or develops a yellow-brown crust. You develop a fever. You have a sore throat. You become short of breath.  MAKE SURE YOU:  Understand these instructions. Will watch your condition. Will get help right away if you are not doing well or get worse.  Thank you for choosing an e-visit.  Your e-visit answers were reviewed by a board certified advanced clinical practitioner to complete your personal care plan. Depending upon the condition, your plan could have included both over the counter or prescription medications.  Please review your  pharmacy choice. Make sure the pharmacy is open so you can pick up prescription now. If there is a problem, you may contact your provider through [employer] and have the prescription routed to another pharmacy.  Your safety is important to [place]. If you have drug allergies check your prescription carefully.   For the next 24 hours you can use MyChart to ask questions about today's visit, request a non-urgent call back, or ask for a work or school excuse. You will get an email in the next two days asking about your experience. I hope that your e-visit has been valuable and will speed your recovery.  I provided 7 minutes of non face-to-face time during this encounter for chart review and documentation.

## 2021-04-27 ENCOUNTER — Other Ambulatory Visit (HOSPITAL_COMMUNITY): Payer: Self-pay

## 2021-06-03 ENCOUNTER — Encounter: Payer: Self-pay | Admitting: Internal Medicine

## 2021-06-03 ENCOUNTER — Ambulatory Visit (INDEPENDENT_AMBULATORY_CARE_PROVIDER_SITE_OTHER): Payer: No Typology Code available for payment source | Admitting: Internal Medicine

## 2021-06-03 ENCOUNTER — Other Ambulatory Visit: Payer: Self-pay

## 2021-06-03 VITALS — BP 132/86 | HR 94 | Temp 98.6°F | Resp 18 | Ht 65.0 in | Wt 214.0 lb

## 2021-06-03 DIAGNOSIS — Z0000 Encounter for general adult medical examination without abnormal findings: Secondary | ICD-10-CM

## 2021-06-03 DIAGNOSIS — Z1159 Encounter for screening for other viral diseases: Secondary | ICD-10-CM

## 2021-06-03 DIAGNOSIS — Z7689 Persons encountering health services in other specified circumstances: Secondary | ICD-10-CM | POA: Diagnosis not present

## 2021-06-03 DIAGNOSIS — Z720 Tobacco use: Secondary | ICD-10-CM | POA: Insufficient documentation

## 2021-06-03 DIAGNOSIS — Z2821 Immunization not carried out because of patient refusal: Secondary | ICD-10-CM | POA: Insufficient documentation

## 2021-06-03 DIAGNOSIS — G932 Benign intracranial hypertension: Secondary | ICD-10-CM | POA: Diagnosis not present

## 2021-06-03 NOTE — Progress Notes (Signed)
New Patient Office Visit  Subjective:  Patient ID: Paula Burgess, female    DOB: 1985/10/18  Age: 35 y.o. MRN: [id number]  CC:  Chief Complaint  Patient presents with   New Patient (Initial Visit)    HPI Paula Burgess is a 35-yer-old female with PMH of IIH s/p shunt placement, CSF rhinorrhea and tobacco abuse who presents for establishing care and annual physical.  She has been doing well overall. She has had shunt placement for IIH. Her headaches and CSF rhinorrhea have improved now. She follows up with Neurosurgeon and ENT specialist.  She had tinea versicolor recently, for which she has been applying Ketoconazole shampoo and has been better now. Still has mild rash on the back, but denies itching.  She smokes about 0.5 pack/day.  She had exemption for COVID vaccine. She denies flu and pneumococcal vaccines.  Past Medical History:  Diagnosis Date   GERD (gastroesophageal reflux disease)    Intracranial hypertension     Past Surgical History:  Procedure Laterality Date   ceribrial stent     CESAREAN SECTION  2012   CESAREAN SECTION N/A 02/12/2014   Procedure: Repeat CESAREAN SECTION;  Surgeon: Lovenia Kim, MD;  Location: [facility];  Service: Obstetrics;  Laterality: N/A;   DILATION AND EVACUATION N/A 01/23/2021   Procedure: DILATATION AND EVACUATION;  Surgeon: Armandina Stammer, DO;  Location: [facility];  Service: Gynecology;  Laterality: N/A;    Family History  Problem Relation Age of Onset   Healthy Mother    Healthy Father    Diabetes Maternal Grandmother    Diabetes Maternal Grandfather    Diabetes Paternal Grandmother    Diabetes Paternal Grandfather    Migraines Neg Hx    Pseudotumor cerebri Neg Hx     Social History   Socioeconomic History   Marital status: Divorced    Spouse name: Not on file   Number of children: 2   Years of education: Not on file   Highest education level: Associate degree: occupational, Hotel manager, or vocational program   Occupational History   Not on file  Tobacco Use   Smoking status: Every Day    Packs/day: 0.50    Years: 15.00    Pack years: 7.50    Types: Cigarettes    Start date: 2006   Smokeless tobacco: Never  Vaping Use   Vaping Use: Never used  Substance and Sexual Activity   Alcohol use: No   Drug use: No   Sexual activity: Not on file  Other Topics Concern   Not on file  Social History Narrative   Lives with her children   Right handed   Caffeine: maybe 3 cups/day   Social Determinants of Health   Financial Resource Strain: Not on file  Food Insecurity: Not on file  Transportation Needs: Not on file  Physical Activity: Not on file  Stress: Not on file  Social Connections: Not on file  Intimate Partner Violence: Not on file    ROS Review of Systems  Constitutional:  Negative for chills and fever.  HENT:  Positive for rhinorrhea. Negative for congestion, sinus pressure, sinus pain and sore throat.   Eyes:  Negative for pain and discharge.  Respiratory:  Negative for cough and shortness of breath.   Cardiovascular:  Negative for chest pain and palpitations.  Gastrointestinal:  Negative for abdominal pain, constipation, diarrhea, nausea and vomiting.  Endocrine: Negative for polydipsia and polyuria.  Genitourinary:  Negative for dysuria and hematuria.  Musculoskeletal:  Negative for neck pain and neck stiffness.  Skin:  Positive for rash.  Neurological:  Positive for dizziness and headaches. Negative for weakness.  Psychiatric/Behavioral:  Negative for agitation and behavioral problems.    Objective:   Today's Vitals: BP 132/86 (BP Location: Right Arm, Patient Position: Sitting, Cuff Size: Large)   Pulse 94   Temp 98.6 F (37 C)   Resp 18   Ht _0  (1.651 m)   Wt 214 lb (97.1 kg)   LMP 11/17/2020   SpO2 98%   BMI 35.61 kg/m   Physical Exam Vitals reviewed.  Constitutional:      General: She is not in acute distress.    Appearance: She is not diaphoretic.   HENT:     Head: Normocephalic.     Nose: Nose normal.     Mouth/Throat:     Mouth: Mucous membranes are moist.  Eyes:     General: No scleral icterus.    Extraocular Movements: Extraocular movements intact.  Neck:     Vascular: No carotid bruit.  Cardiovascular:     Rate and Rhythm: Normal rate and regular rhythm.     Pulses: Normal pulses.     Heart sounds: Normal heart sounds. No murmur heard. Pulmonary:     Breath sounds: Normal breath sounds. No wheezing or rales.  Abdominal:     Palpations: Abdomen is soft.     Tenderness: There is no abdominal tenderness.  Musculoskeletal:     Cervical back: Neck supple. No tenderness.     Right lower leg: No edema.     Left lower leg: No edema.  Skin:    General: Skin is warm.     Findings: No rash.  Neurological:     General: No focal deficit present.     Mental Status: She is alert and oriented to person, place, and time.     Cranial Nerves: No cranial nerve deficit.     Sensory: No sensory deficit.     Motor: No weakness.  Psychiatric:        Mood and Affect: Mood normal.        Behavior: Behavior normal.    Assessment & Plan:   Problem List Items Addressed This Visit       Annual physical exam - Primary   Annual exam as documented. Counseling done  re healthy lifestyle involving commitment to 150 minutes exercise per week, heart healthy diet, and attaining healthy weight.The importance of adequate sleep also discussed. Changes in health habits are decided on by the patient with goals and time frames  set for achieving them. Immunization and cancer screening needs are specifically addressed at this visit.     Relevant Orders  CBC with Differential/Platelet  CMP14+EGFR  Lipid Profile  TSH  HgB A1c    Nervous and Auditory   IIH (idiopathic intracranial hypertension)    With CSF rhinorrhea Followed by ENT and Neurosurgeon S/p shunt placement Headaches better now, still has rhinorrhea, but manageable according to  the patient.        Other      Encounter to establish care    Care established History reviewed with the patient      Vaccination refused by patient    Refused flu and PCV20 vaccination      Other Visit Diagnoses     Need for hepatitis C screening test       Relevant Orders   Hepatitis C Antibody  Outpatient Encounter Medications as of 06/03/2021  Medication Sig   [DISCONTINUED] ketoconazole (NIZORAL) 2 % shampoo Apply topically 2 times a week.   [DISCONTINUED] phentermine (ADIPEX-P) 37.5 MG tablet Take 1 tablet by mouth once daily   No facility-administered encounter medications on file as of 06/03/2021.    Follow-up: Return in about 1 year (around 06/03/2022) for Annual physical.   Lindell Spar, MD

## 2021-06-03 NOTE — Assessment & Plan Note (Signed)
Annual exam as documented. Counseling done  re healthy lifestyle involving commitment to 150 minutes exercise per week, heart healthy diet, and attaining healthy weight.The importance of adequate sleep also discussed. Changes in health habits are decided on by the patient with goals and time frames  set for achieving them. Immunization and cancer screening needs are specifically addressed at this visit. 

## 2021-06-03 NOTE — Assessment & Plan Note (Signed)
With CSF rhinorrhea Followed by ENT and Neurosurgeon S/p shunt placement Headaches better now, still has rhinorrhea, but manageable according to the patient.

## 2021-06-03 NOTE — Assessment & Plan Note (Signed)
Care established History reviewed with the patient

## 2021-06-03 NOTE — Assessment & Plan Note (Signed)
Smokes about 0.5 pack/day  Asked about quitting: confirms that she currently smokes cigarettes Advise to quit smoking: Educated about QUITTING to reduce the risk of cancer, cardio and cerebrovascular disease. Assess willingness: Unwilling to quit at this time, but is working on cutting back. Assist with counseling and pharmacotherapy: Counseled for 5 minutes and literature provided. Arrange for follow up: Follow up in 3 months and continue to offer help. 

## 2021-06-03 NOTE — Patient Instructions (Addendum)
Please get fasting blood tests within a week.  Please continue to follow heart healthy diet and perform moderate exercise/walking at least 150 mins/week.  Please continue to follow up with Neurosurgeon and Ophthalmology as scheduled.  Please consider getting PCV20 vaccine.

## 2021-06-03 NOTE — Assessment & Plan Note (Signed)
Refused flu and PCV20 vaccination

## 2021-06-09 LAB — LIPID PANEL
Chol/HDL Ratio: 3.1 ratio (ref 0.0–4.4)
Cholesterol, Total: 157 mg/dL (ref 100–199)
HDL: 50 mg/dL (ref >39)
LDL Chol Calc (NIH): 91 mg/dL (ref 0–99)
Triglycerides: 83 mg/dL (ref 0–149)
VLDL Cholesterol Cal: 16 mg/dL (ref 5–40)

## 2021-06-09 LAB — CMP14+EGFR
ALT: 15 IU/L (ref 0–32)
AST: 16 IU/L (ref 0–40)
Albumin/Globulin Ratio: 2.2 (ref 1.2–2.2)
Albumin: 4.6 g/dL (ref 3.8–4.8)
Alkaline Phosphatase: 65 IU/L (ref 44–121)
BUN/Creatinine Ratio: 12 (ref 9–23)
BUN: 10 mg/dL (ref 6–20)
Bilirubin Total: <0.2 mg/dL (ref 0.0–1.2)
CO2: 21 mmol/L (ref 20–29)
Calcium: 8.9 mg/dL (ref 8.7–10.2)
Chloride: 101 mmol/L (ref 96–106)
Creatinine, Ser: 0.85 mg/dL (ref 0.57–1.00)
Globulin, Total: 2.1 g/dL (ref 1.5–4.5)
Glucose: 101 mg/dL — ABNORMAL HIGH (ref 65–99)
Potassium: 4.1 mmol/L (ref 3.5–5.2)
Sodium: 137 mmol/L (ref 134–144)
Total Protein: 6.7 g/dL (ref 6.0–8.5)
eGFR: 91 mL/min/{1.73_m2} (ref >59)

## 2021-06-09 LAB — CBC WITH DIFFERENTIAL/PLATELET
Basophils Absolute: 0 10*3/uL (ref 0.0–0.2)
Basos: 0 %
EOS (ABSOLUTE): 0.1 10*3/uL (ref 0.0–0.4)
Eos: 1 %
Hematocrit: 36.4 % (ref 34.0–46.6)
Hemoglobin: 11.2 g/dL (ref 11.1–15.9)
Immature Grans (Abs): 0 10*3/uL (ref 0.0–0.1)
Immature Granulocytes: 0 %
Lymphocytes Absolute: 3.3 10*3/uL — ABNORMAL HIGH (ref 0.7–3.1)
Lymphs: 56 %
MCH: 23.5 pg — ABNORMAL LOW (ref 26.6–33.0)
MCHC: 30.8 g/dL — ABNORMAL LOW (ref 31.5–35.7)
MCV: 76 fL — ABNORMAL LOW (ref 79–97)
Monocytes Absolute: 0.4 10*3/uL (ref 0.1–0.9)
Monocytes: 7 %
Neutrophils Absolute: 2.2 10*3/uL (ref 1.4–7.0)
Neutrophils: 36 %
Platelets: 319 10*3/uL (ref 150–450)
RBC: 4.77 x10E6/uL (ref 3.77–5.28)
RDW: 21 % — ABNORMAL HIGH (ref 11.7–15.4)
WBC: 6.1 10*3/uL (ref 3.4–10.8)

## 2021-06-09 LAB — HEMOGLOBIN A1C
Est. average glucose Bld gHb Est-mCnc: 108 mg/dL
Hgb A1c MFr Bld: 5.4 % (ref 4.8–5.6)

## 2021-06-09 LAB — HEPATITIS C ANTIBODY: Hep C Virus Ab: <0.1 s/co ratio (ref 0.0–0.9)

## 2021-06-09 LAB — TSH: TSH: 1.92 u[IU]/mL (ref 0.450–4.500)

## 2021-09-16 ENCOUNTER — Encounter: Payer: Self-pay | Admitting: Neurology

## 2021-09-16 ENCOUNTER — Ambulatory Visit (INDEPENDENT_AMBULATORY_CARE_PROVIDER_SITE_OTHER): Payer: No Typology Code available for payment source | Admitting: Neurology

## 2021-09-16 ENCOUNTER — Other Ambulatory Visit (HOSPITAL_COMMUNITY): Payer: Self-pay

## 2021-09-16 VITALS — BP 112/75 | HR 83 | Ht 65.0 in | Wt 224.2 lb

## 2021-09-16 DIAGNOSIS — G932 Benign intracranial hypertension: Secondary | ICD-10-CM

## 2021-09-16 MED ORDER — TOPIRAMATE 25 MG PO TABS
ORAL_TABLET | ORAL | 3 refills | Status: DC
  Filled 2021-09-16: qty 180, 90d supply, fill #0
  Filled 2021-12-16: qty 180, 90d supply, fill #1

## 2021-09-16 MED ORDER — RIZATRIPTAN BENZOATE 10 MG PO TBDP
10.0000 mg | ORAL_TABLET | ORAL | 11 refills | Status: AC | PRN
Start: 2021-09-16 — End: ?
  Filled 2021-09-16: qty 9, 30d supply, fill #0

## 2021-09-16 NOTE — Patient Instructions (Addendum)
Try Topiramate Maxalt: Please take one tablet at the onset of your headache. If it does not improve the symptoms please take one additional tablet. Do not take more then 2 tablets in 24hrs. Do not take use more then 2 to 3 times in a week.   Topiramate Tablets What is this medication? TOPIRAMATE (toe PYRE a mate) prevents and controls seizures in people with epilepsy. It may also be used to prevent migraine headaches. It works by calming overactive nerves in your body. This medicine may be used for other purposes; ask your health care provider or pharmacist if you have questions. COMMON BRAND NAME(S): Topamax, Topiragen What should I tell my care team before I take this medication? Rizatriptan Tablets What is this medication? RIZATRIPTAN (rye za TRIP tan) treats migraines. It works by blocking pain signals and narrowing blood vessels in the brain. It belongs to a group of medications called triptans. It is not used to prevent migraines. This medicine may be used for other purposes; ask your health care provider or pharmacist if you have questions. COMMON BRAND NAME(S): Maxalt What should I tell my care team before I take this medication? They need to know if you have any of these conditions: Cigarette smoker Circulation problems in fingers and toes Diabetes Heart disease High blood pressure High cholesterol History of irregular heartbeat History of stroke Kidney disease Liver disease Stomach or intestine problems An unusual or allergic reaction to rizatriptan, other medications, foods, dyes, or preservatives Pregnant or trying to get pregnant Breast-feeding How should I use this medication? Take this medication by mouth with a glass of water. Follow the directions on the prescription label. Do not take it more often than directed. Talk to your care team regarding the use of this medication in children. While this medication may be prescribed for children as young as 6 years for  selected conditions, precautions do apply. Overdosage: If you think you have taken too much of this medicine contact a poison control center or emergency room at once. NOTE: This medicine is only for you. Do not share this medicine with others. What if I miss a dose? This does not apply. This medication is not for regular use. What may interact with this medication? Do not take this medication with any of the following: Certain medications for migraine headache like almotriptan, eletriptan, frovatriptan, naratriptan, rizatriptan, sumatriptan, zolmitriptan Ergot alkaloids like dihydroergotamine, ergonovine, ergotamine, methylergonovine MAOIs like Carbex, Eldepryl, Marplan, Nardil, and Parnate This medication may also interact with the following: Certain medications for depression, anxiety, or psychotic disorders Propranolol This list may not describe all possible interactions. Give your health care provider a list of all the medicines, herbs, non-prescription drugs, or dietary supplements you use. Also tell them if you smoke, drink alcohol, or use illegal drugs. Some items may interact with your medicine. What should I watch for while using this medication? Visit your care team for regular checks on your progress. Tell your care team if your symptoms do not start to get better or if they get worse. You may get drowsy or dizzy. Do not drive, use machinery, or do anything that needs mental alertness until you know how this medication affects you. Do not stand up or sit up quickly, especially if you are an older patient. This reduces the risk of dizzy or fainting spells. Alcohol may interfere with the effect of this medication. Your mouth may get dry. Chewing sugarless gum or sucking hard candy and drinking plenty of water may help.  Contact your care team if the problem does not go away or is severe. If you take migraine medications for 10 or more days a month, your migraines may get worse. Keep a  diary of headache days and medication use. Contact your care team if your migraine attacks occur more frequently. What side effects may I notice from receiving this medication? Side effects that you should report to your care team as soon as possible: Allergic reactions--skin rash, itching, hives, swelling of the face, lips, tongue, or throat Burning, pain, tingling, or color changes in the legs or feet Heart attack--pain or tightness in the chest, shoulders, arms, or jaw, nausea, shortness of breath, cold or clammy skin, feeling faint or lightheaded Heart rhythm changes--fast or irregular heartbeat, dizziness, feeling faint or lightheaded, chest pain, trouble breathing Increase in blood pressure Irritability, confusion, fast or irregular heartbeat, muscle stiffness, twitching muscles, sweating, high fever, seizure, chills, vomiting, diarrhea, which may be signs of serotonin syndrome Raynaud's--cool, numb, or painful fingers or toes that may change color from pale, to blue, to red Seizures Stroke--sudden numbness or weakness of the face, arm, or leg, trouble speaking, confusion, trouble walking, loss of balance or coordination, dizziness, severe headache, change in vision Sudden or severe stomach pain, nausea, vomiting, fever, or bloody diarrhea Vision loss Side effects that usually do not require medical attention (report to your care team if they continue or are bothersome): Dizziness General discomfort or fatigue This list may not describe all possible side effects. Call your doctor for medical advice about side effects. You may report side effects to FDA at 1-800-FDA-1088. Where should I keep my medication? Keep out of the reach of children and pets. Store at room temperature between 15 and 30 degrees C (59 and 86 degrees F). Keep container tightly closed. Throw away any unused medication after the expiration date. NOTE: This sheet is a summary. It may not cover all possible information. If  you have questions about this medicine, talk to your doctor, pharmacist, or health care provider.  2022 Elsevier/Gold Standard (2020-11-04 00:00:00)  Bleeding disorder Kidney disease Lung disease Suicidal thoughts, plans or attempt An unusual or allergic reaction to topiramate, other medications, foods, dyes, or preservatives Pregnant or trying to get pregnant Breast-feeding How should I use this medication? Take this medication by mouth with water. Take it as directed on the prescription label at the same time every day. Do not cut, crush or chew this medicine. Swallow the tablets whole. You can take it with or without food. If it upsets your stomach, take it with food. Keep taking it unless your care team tells you to stop. A special MedGuide will be given to you by the pharmacist with each prescription and refill. Be sure to read this information carefully each time. Talk to your care team about the use of this medication in children. While it may be prescribed for children as young as 2 years for selected conditions, precautions do apply. Overdosage: If you think you have taken too much of this medicine contact a poison control center or emergency room at once. NOTE: This medicine is only for you. Do not share this medicine with others. What if I miss a dose? If you miss a dose, take it as soon as you can unless it is within 6 hours of the next dose. If it is within 6 hours of the next dose, skip the missed dose. Take the next dose at the normal time. Do not take double or  extra doses. What may interact with this medication? Acetazolamide Alcohol Antihistamines for allergy, cough, and cold Aspirin and aspirin-like medications Atropine Birth control pills Certain medications for anxiety or sleep Certain medications for bladder problems like oxybutynin, tolterodine Certain medications for depression like amitriptyline, fluoxetine, sertraline Certain medications for Parkinson's disease  like benztropine, trihexyphenidyl Certain medications for seizures like carbamazepine, lamotrigine, phenobarbital, phenytoin, primidone, valproic acid, zonisamide Certain medications for stomach problems like dicyclomine, hyoscyamine Certain medications for travel sickness like scopolamine Certain medications that treat or prevent blood clots like warfarin, enoxaparin, dalteparin, apixaban, dabigatran, and rivaroxaban Digoxin Diltiazem General anesthetics like halothane, isoflurane, methoxyflurane, propofol Glyburide Hydrochlorothiazide Ipratropium Lithium Medications that relax muscles Metformin Narcotic medications for pain NSAIDs, medications for pain and inflammation, like ibuprofen or naproxen Phenothiazines like chlorpromazine, mesoridazine, prochlorperazine, thioridazine Pioglitazone This list may not describe all possible interactions. Give your health care provider a list of all the medicines, herbs, non-prescription drugs, or dietary supplements you use. Also tell them if you smoke, drink alcohol, or use illegal drugs. Some items may interact with your medicine. What should I watch for while using this medication? Visit your care team for regular checks on your progress. Tell your care team if your symptoms do not start to get better or if they get worse. Do not suddenly stop taking this medication. You may develop a severe reaction. Your care team will tell you how much medication to take. If your care team wants you to stop the medication, the dose may be slowly lowered over time to avoid any side effects. Wear a medical ID bracelet or chain. Carry a card that describes your condition. List the medications and doses you take on the card. You may get drowsy or dizzy. Do not drive, use machinery, or do anything that needs mental alertness until you know how this medication affects you. Do not stand up or sit up quickly, especially if you are an older patient. This reduces the risk of  dizzy or fainting spells. Alcohol may interfere with the effects of this medication. Avoid alcoholic drinks. This medication may cause serious skin reactions. They can happen weeks to months after starting the medication. Contact your care team right away if you notice fevers or flu-like symptoms with a rash. The rash may be red or purple and then turn into blisters or peeling of the skin. Or, you might notice a red rash with swelling of the face, lips or lymph nodes in your neck or under your arms. Watch for new or worsening thoughts of suicide or depression. This includes sudden changes in mood, behaviors, or thoughts. These changes can happen at any time but are more common in the beginning of treatment or after a change in dose. Call your care team right away if you experience these thoughts or worsening depression. This medication may slow your child's growth if it is taken for a long time at high doses. Your care team will monitor your child's growth. Using this medication for a long time may weaken your bones. The risk of bone fractures may be increased. Talk to your care team about your bone health. Do not become pregnant while taking this medication. Hormone forms of birth control may not work as well with this medication. Talk to your care team about other forms of birth control. There is potential for serious harm to an unborn child. Tell your care team right away if you think you might be pregnant. What side effects may I notice from receiving  this medication? Side effects that you should report to your care team as soon as possible: Allergic reactions--skin rash, itching, hives, swelling of the face, lips, tongue, or throat High acid level--trouble breathing, unusual weakness or fatigue, confusion, headache, fast or irregular heartbeat, nausea, vomiting High ammonia level--unusual weakness or fatigue, confusion, loss of appetite, nausea, vomiting, seizures Fever that does not go away,  decrease in sweat Kidney stones--blood in the urine, pain or trouble passing urine, pain in the lower back or sides Redness, blistering, peeling or loosening of the skin, including inside the mouth Sudden eye pain or change in vision such as blurry vision, seeing halos around lights, vision loss Thoughts of suicide or self-harm, worsening mood, feelings of depression Side effects that usually do not require medical attention (report to your care team if they continue or are bothersome): Burning or tingling sensation in hands or feet Difficulty with paying attention, memory, or speech Dizziness Drowsiness Fatigue Loss of appetite with weight loss Slow or sluggish movements of the body This list may not describe all possible side effects. Call your doctor for medical advice about side effects. You may report side effects to FDA at 1-800-FDA-1088. Where should I keep my medication? Keep out of the reach of children and pets. Store between 15 and 30 degrees C (59 and 86 degrees F). Protect from moisture. Keep the container tightly closed. Get rid of any unused medication after the expiration date. To get rid of medications that are no longer needed or have expired: Take the medication to a medication take-back program. Check with your pharmacy or law enforcement to find a location. If you cannot return the medication, check the label or package insert to see if the medication should be thrown out in the garbage or flushed down the toilet. If you are not sure, ask your care team. If it is safe to put it in the trash, empty the medication out of the container. Mix the medication with cat litter, dirt, coffee grounds, or other unwanted substance. Seal the mixture in a bag or container. Put it in the trash. NOTE: This sheet is a summary. It may not cover all possible information. If you have questions about this medicine, talk to your doctor, pharmacist, or health care provider.  2022 Elsevier/Gold  Standard (2020-12-21 00:00:00)

## 2021-09-16 NOTE — Progress Notes (Signed)
[id number]    Provider:  Dr Jaynee Eagles Requesting Provider: Lindell Spar, MD Primary Care Provider:  Lindell Spar, MD  CC:  Headache  09/16/2021: She had a csf leak and went to [facility]. She had a transverse sinus stent. So kept off about 30 pounds. Went off of the diamox. Saw dr Katy Fitch 06/2021 without papilledema(reviewed Dr. Zenia Resides notes). 80% better. She still has the headaches sometimes 1-2x a week. August of this year had some right-sided headache, took the diamox a few days, it helped. She has had neck soreness. Sharp in one spot, unilateral, no vision changes, helps with goody powder.  Interval history 01/26/2020: Here for follow up of IDIOPATHIC INTRACRANIAL HYPERTENSION. Opening pressue was 37 cm water. Started on diamox 250mg  bid. She says her symptoms are "1000x better" on the diamox. She hasn't seen Dr. Katy Fitch, we will give her the number to call. She still has a little tinnitus but no hedaches very much improved, she is thrilled. She declines the Healthy Weight and [place], she is on phentermine and she has lost 45 pounds.   HPI:  Paula Burgess is a 35 y.o. female here as requested by Lindell Spar, MD for optic nerve edema. Tinniitus started about a year ago.Went to ENT who ordered MRI of the brain and cat scan. She has headaches, she has headaches, she wakes up with headaches, can be severe. Mostly dull. But severe ones can be ice-pick. Headache son the right side behind the right eye. Mostly dull. Daily but moderate in pain. She says the ringing in the ears is more swishing. Worse when up and moving around. She has blurry vison but not all the time. Stress may make things worse. She went to see the eye doctor and has new glasses which are not helping. Nothing makes it better. Continues moderate headache with severe exacerbations, blurry vision, pulsating in the ears. When the symptoms started she had gained 60 lbs. Losing 40 lbs has helped. She has chronic  neck pain. No IUD, no long-term antibiotics or vitamin A. No other focal neurologic deficits, associated symptoms, inciting events or modifiable factors.  Reviewed notes, labs and imaging from outside physicians, which showed :  Reviewed CTA of the head: 1. Negative head CTA. 2. Partially empty sella.  Partially empty sella turcica. This is very commonly an incidental finding, but may be seen in the setting of idiopathic intracranial Hypertension. Personally reviewed MRi brain.  Review of Systems: Patient complains of symptoms per HPI as well as the following symptoms: headache . Pertinent negatives and positives per HPI. All others negative     Social History   Socioeconomic History   Marital status: Divorced    Spouse name: Not on file   Number of children: 2   Years of education: Not on file   Highest education level: Associate degree: occupational, Hotel manager, or vocational program  Occupational History   Not on file  Tobacco Use   Smoking status: Every Day    Packs/day: 0.50    Years: 15.00    Pack years: 7.50    Types: Cigarettes    Start date: 2006   Smokeless tobacco: Never  Vaping Use   Vaping Use: Never used  Substance and Sexual Activity   Alcohol use: Yes    Comment: occ   Drug use: No   Sexual activity: Not on file  Other Topics Concern   Not on file  Social History Narrative   Lives with her  children   Right handed   Caffeine: maybe 3 cups/day   Social Determinants of Health   Financial Resource Strain: Not on file  Food Insecurity: Not on file  Transportation Needs: Not on file  Physical Activity: Not on file  Stress: Not on file  Social Connections: Not on file  Intimate Partner Violence: Not on file    Family History  Problem Relation Age of Onset   Healthy Mother    Healthy Father    Diabetes Maternal Grandmother    Diabetes Maternal Grandfather    Diabetes Paternal Grandmother    Diabetes Paternal Grandfather    Migraines Neg Hx     Pseudotumor cerebri Neg Hx    Heart failure Neg Hx     Past Medical History:  Diagnosis Date   GERD (gastroesophageal reflux disease)    Intracranial hypertension     Patient Active Problem List   Diagnosis Date Noted   Annual physical exam 06/03/2021   Encounter to establish care 06/03/2021   Vaccination refused by patient 06/03/2021   Tobacco abuse 06/03/2021   IIH (idiopathic intracranial hypertension) 12/10/2019    Past Surgical History:  Procedure Laterality Date   ceribrial stent     CESAREAN SECTION  2012   CESAREAN SECTION N/A 02/12/2014   Procedure: Repeat CESAREAN SECTION;  Surgeon: Lovenia Kim, MD;  Location: [facility];  Service: Obstetrics;  Laterality: N/A;   DILATION AND EVACUATION N/A 01/23/2021   Procedure: DILATATION AND EVACUATION;  Surgeon: Armandina Stammer, DO;  Location: [place];  Service: Gynecology;  Laterality: N/A;    Current Outpatient Medications  Medication Sig Dispense Refill   rizatriptan (MAXALT-MLT) 10 MG disintegrating tablet Dissolve 1 tablet (10 mg total) by mouth as needed for migraine. May repeat in 2 hours if needed 9 tablet 11   topiramate (TOPAMAX) 25 MG tablet Start with 1 tablet by mouth at bedtime. May increase to 2 tablets if needed. 180 tablet 3   No current facility-administered medications for this visit.    Allergies as of 09/16/2021   (No Known Allergies)    Vitals: BP 112/75   Pulse 83   Ht 5\' 5"  (1.651 m)   Wt 224 lb 3.2 oz (101.7 kg)   LMP 11/17/2020   BMI 37.31 kg/m  Last Weight:  Wt Readings from Last 1 Encounters:  09/16/21 224 lb 3.2 oz (101.7 kg)   Last Height:   Ht Readings from Last 1 Encounters:  09/16/21 5\' 5"  (1.651 m)   Physical exam: Exam: Gen: NAD, conversant, well nourised, obese, well groomed                     CV: RRR, no MRG. No Carotid Bruits. No peripheral edema, warm, nontender Eyes: Conjunctivae clear without exudates or hemorrhage  Neuro: Detailed Neurologic Exam  Speech:     Speech is normal; fluent and spontaneous with normal comprehension.  Cognition:    The patient is oriented to person, place, and time;     recent and remote memory intact;     language fluent;     normal attention, concentration,     fund of knowledge Cranial Nerves:    The pupils are equal, round, and reactive to light. The fundi are normal and spontaneous venous pulsations are present. Visual fields are full to finger confrontation. Extraocular movements are intact. Trigeminal sensation is intact and the muscles of mastication are normal. The face is symmetric. The palate elevates in the midline.  Hearing intact. Voice is normal. Shoulder shrug is normal. The tongue has normal motion without fasciculations.   Coordination:    Normal    Gait:     normal.   Motor Observation:    No asymmetry, no atrophy, and no involuntary movements noted. Tone:    Normal muscle tone.    Posture:    Posture is normal. normal erect    Strength:    Strength is V/V in the upper and lower limbs.      Sensation: intact to LT     Reflex Exam:  DTR's:    Deep tendon reflexes in the upper and lower extremities are normal bilaterally.   Toes:    The toes are downgoing bilaterally.   Clonus:    Clonus is absent.    Assessment/Plan:  35 year old here  for follow up of IDIOPATHIC INTRACRANIAL HYPERTENSION. Opening pressue was 37 cm water with papilledema. Started on diamox 250mg  bid. Reported her symptoms are "1000x better" on the diamox(this was last yeat). But since being seen,  she had a csf leak, [facility], [facility] and also [facility] and had venous sinus stenting and has been off of the diamox doing well. Feels 80% better, still  with some headaches, does not want to repeat LP or imaging. Will try on low dose topamax, no papilledema.  Weight loss is critical, discussed weight loss and risk of permanent  Much appreciate my wonderful colleague Dr. Midge Aver who recently saw her with normal exam, no  papilledema Try Topiramate Maxalt: Please take one tablet at the onset of your headache. If it does not improve the symptoms please take one additional tablet. Do not take more then 2 tablets in 24hrs. Do not take use more then 2 to 3 times in a week.  Meds ordered this encounter  Medications   topiramate (TOPAMAX) 25 MG tablet    Sig: Start with 1 tablet by mouth at bedtime. May increase to 2 tablets if needed.    Dispense:  180 tablet    Refill:  3   rizatriptan (MAXALT-MLT) 10 MG disintegrating tablet    Sig: Dissolve 1 tablet (10 mg total) by mouth as needed for migraine. May repeat in 2 hours if needed    Dispense:  9 tablet    Refill:  11   For any acute change especially worsening headache or vision loss call 911 and proceed to ED especially vision changes   Cc: Lindell Spar, MD,  Midge Aver, MD  Sarina Ill, MD  [facility] [address] [place], [place] [address]  Phone [phone] Fax [phone]  I spent 30 minutes of face-to-face and non-face-to-face time with patient on the  1. IIH (idiopathic intracranial hypertension)     diagnosis.  This included previsit chart review, lab review, study review, order entry, electronic health record documentation, patient education on the different diagnostic and therapeutic options, counseling and coordination of care, risks and benefits of management, compliance, or risk factor reduction

## 2021-09-19 ENCOUNTER — Encounter: Payer: Self-pay | Admitting: Neurology

## 2021-10-18 ENCOUNTER — Ambulatory Visit: Payer: No Typology Code available for payment source | Admitting: Neurology

## 2021-11-12 ENCOUNTER — Other Ambulatory Visit: Payer: Self-pay | Admitting: Obstetrics and Gynecology

## 2021-11-12 DIAGNOSIS — N644 Mastodynia: Secondary | ICD-10-CM

## 2021-11-13 ENCOUNTER — Other Ambulatory Visit: Payer: Self-pay | Admitting: Obstetrics and Gynecology

## 2021-11-13 DIAGNOSIS — N644 Mastodynia: Secondary | ICD-10-CM

## 2021-11-15 ENCOUNTER — Encounter: Payer: Self-pay | Admitting: Neurology

## 2021-11-17 ENCOUNTER — Encounter: Payer: Self-pay | Admitting: Internal Medicine

## 2021-11-17 ENCOUNTER — Other Ambulatory Visit (HOSPITAL_COMMUNITY): Payer: Self-pay

## 2021-11-17 ENCOUNTER — Ambulatory Visit: Payer: No Typology Code available for payment source | Admitting: Internal Medicine

## 2021-11-17 ENCOUNTER — Ambulatory Visit (INDEPENDENT_AMBULATORY_CARE_PROVIDER_SITE_OTHER): Payer: No Typology Code available for payment source | Admitting: Internal Medicine

## 2021-11-17 ENCOUNTER — Other Ambulatory Visit: Payer: Self-pay

## 2021-11-17 VITALS — BP 112/72 | HR 65 | Resp 18 | Ht 65.0 in | Wt 221.0 lb

## 2021-11-17 DIAGNOSIS — G932 Benign intracranial hypertension: Secondary | ICD-10-CM

## 2021-11-17 DIAGNOSIS — E669 Obesity, unspecified: Secondary | ICD-10-CM

## 2021-11-17 MED ORDER — SEMAGLUTIDE-WEIGHT MANAGEMENT 0.5 MG/0.5ML ~~LOC~~ SOAJ
0.5000 mg | SUBCUTANEOUS | 0 refills | Status: AC
  Filled 2021-11-17 – 2021-12-16 (×2): qty 2, 28d supply, fill #0

## 2021-11-17 MED ORDER — SEMAGLUTIDE-WEIGHT MANAGEMENT 0.25 MG/0.5ML ~~LOC~~ SOAJ
0.2500 mg | SUBCUTANEOUS | 0 refills | Status: AC
  Filled 2021-11-17: qty 2, 28d supply, fill #0

## 2021-11-17 NOTE — Progress Notes (Signed)
Established Patient Office Visit  Subjective:  Patient ID: Paula Burgess, female    DOB: [birth year]  Age: 36 y.o. MRN: [id number]  CC:  Chief Complaint  Patient presents with   Weight Loss    Discuss weight loss medication went to see neurology and they want to her to try weight loss there is a 10 month weight loss clinic     HPI Paula Burgess is a 36 y.o. female with past medical history of IIH s/p shunt placement, CSF rhinorrhea and tobacco abuse who presents for f/u of her chronic medical conditions.  She has been doing well overall. She has had shunt placement for IIH. Her headaches and CSF rhinorrhea have improved now. She follows up with Neurosurgeon and ENT specialist.  She has started taking Topamax after neurology visit recently.  She has been gaining weight despite trying to follow low-carb diet and moderate exercise.  She would benefit from weight loss medical therapy.    Past Medical History:  Diagnosis Date   GERD (gastroesophageal reflux disease)    Intracranial hypertension     Past Surgical History:  Procedure Laterality Date   ceribrial stent     CESAREAN SECTION  2012   CESAREAN SECTION N/A 02/12/2014   Procedure: Repeat CESAREAN SECTION;  Surgeon: Lovenia Kim, MD;  Location: [facility];  Service: Obstetrics;  Laterality: N/A;   DILATION AND EVACUATION N/A 01/23/2021   Procedure: DILATATION AND EVACUATION;  Surgeon: Armandina Stammer, DO;  Location: [place];  Service: Gynecology;  Laterality: N/A;    Family History  Problem Relation Age of Onset   Healthy Mother    Healthy Father    Diabetes Maternal Grandmother    Diabetes Maternal Grandfather    Diabetes Paternal Grandmother    Diabetes Paternal Grandfather    Migraines Neg Hx    Pseudotumor cerebri Neg Hx    Heart failure Neg Hx     Social History   Socioeconomic History   Marital status: Divorced    Spouse name: Not on file   Number of children: 2   Years of education: Not on file    Highest education level: Associate degree: occupational, Hotel manager, or vocational program  Occupational History   Not on file  Tobacco Use   Smoking status: Every Day    Packs/day: 0.50    Years: 15.00    Pack years: 7.50    Types: Cigarettes    Start date: 2006   Smokeless tobacco: Never  Vaping Use   Vaping Use: Never used  Substance and Sexual Activity   Alcohol use: Yes    Comment: occ   Drug use: No   Sexual activity: Not on file  Other Topics Concern   Not on file  Social History Narrative   ** Merged History Encounter **       Lives with her children Right handed Caffeine: maybe 3 cups/day   Social Determinants of Health   Financial Resource Strain: Not on file  Food Insecurity: Not on file  Transportation Needs: Not on file  Physical Activity: Not on file  Stress: Not on file  Social Connections: Not on file  Intimate Partner Violence: Not on file    Outpatient Medications Prior to Visit  Medication Sig Dispense Refill   topiramate (TOPAMAX) 25 MG tablet Start with 1 tablet by mouth at bedtime. May increase to 2 tablets if needed. 180 tablet 3   rizatriptan (MAXALT-MLT) 10 MG disintegrating tablet Dissolve 1 tablet (  10 mg total) by mouth as needed for migraine. May repeat in 2 hours if needed (Patient not taking: Reported on 11/17/2021) 9 tablet 11   No facility-administered medications prior to visit.    No Known Allergies  ROS Review of Systems  Constitutional:  Negative for chills and fever.  HENT:  Positive for rhinorrhea. Negative for congestion, sinus pressure, sinus pain and sore throat.   Eyes:  Negative for pain and discharge.  Respiratory:  Negative for cough and shortness of breath.   Cardiovascular:  Negative for chest pain and palpitations.  Gastrointestinal:  Negative for abdominal pain, diarrhea, nausea and vomiting.  Endocrine: Negative for polydipsia and polyuria.  Genitourinary:  Negative for dysuria and hematuria.  Musculoskeletal:   Negative for neck pain and neck stiffness.  Skin:  Negative for rash.  Neurological:  Positive for dizziness and headaches. Negative for weakness.  Psychiatric/Behavioral:  Negative for agitation and behavioral problems.      Objective:    Physical Exam Vitals reviewed.  Constitutional:      General: She is not in acute distress.    Appearance: She is not diaphoretic.  HENT:     Head: Normocephalic.     Nose: Nose normal.     Mouth/Throat:     Mouth: Mucous membranes are moist.  Eyes:     General: No scleral icterus.    Extraocular Movements: Extraocular movements intact.  Neck:     Vascular: No carotid bruit.  Cardiovascular:     Rate and Rhythm: Normal rate and regular rhythm.     Pulses: Normal pulses.     Heart sounds: Normal heart sounds. No murmur heard. Pulmonary:     Breath sounds: Normal breath sounds. No wheezing or rales.  Abdominal:     Palpations: Abdomen is soft.     Tenderness: There is no abdominal tenderness.  Musculoskeletal:     Cervical back: Neck supple. No tenderness.     Right lower leg: No edema.     Left lower leg: No edema.  Skin:    General: Skin is warm.     Findings: No rash.  Neurological:     General: No focal deficit present.     Mental Status: She is alert and oriented to person, place, and time.     Sensory: No sensory deficit.     Motor: No weakness.  Psychiatric:        Mood and Affect: Mood normal.        Behavior: Behavior normal.    BP 112/72 (BP Location: Right Arm, Patient Position: Sitting, Cuff Size: Normal)    Pulse 65    Resp 18    Ht '5\' 5"'  (1.651 m)    Wt 221 lb (100.2 kg)    SpO2 99%    BMI 36.78 kg/m  Wt Readings from Last 3 Encounters:  11/17/21 221 lb (100.2 kg)  09/16/21 224 lb 3.2 oz (101.7 kg)  06/03/21 214 lb (97.1 kg)    Lab Results  Component Value Date   TSH 1.920 06/08/2021   Lab Results  Component Value Date   WBC 6.1 06/08/2021   HGB 11.2 06/08/2021   HCT 36.4 06/08/2021   MCV 76 (L)  06/08/2021   PLT 319 06/08/2021   Lab Results  Component Value Date   NA 137 06/08/2021   K 4.1 06/08/2021   CO2 21 06/08/2021   GLUCOSE 101 (H) 06/08/2021   BUN 10 06/08/2021   CREATININE 0.85 06/08/2021  BILITOT <0.2 06/08/2021   ALKPHOS 65 06/08/2021   AST 16 06/08/2021   ALT 15 06/08/2021   PROT 6.7 06/08/2021   ALBUMIN 4.6 06/08/2021   CALCIUM 8.9 06/08/2021   EGFR 91 06/08/2021   Lab Results  Component Value Date   CHOL 157 06/08/2021   Lab Results  Component Value Date   HDL 50 06/08/2021   Lab Results  Component Value Date   LDLCALC 91 06/08/2021   Lab Results  Component Value Date   TRIG 83 06/08/2021   Lab Results  Component Value Date   CHOLHDL 3.1 06/08/2021   Lab Results  Component Value Date   HGBA1C 5.4 06/08/2021      Assessment & Plan:   Problem List Items Addressed This Visit       Nervous and Auditory   IIH (idiopathic intracranial hypertension)    With CSF rhinorrhea Followed by ENT and Neurosurgeon S/p shunt placement Headaches better now, still has rhinorrhea, but manageable according to the patient - now on Topamax, followed by Neurology        Other   Obesity (BMI 30-39.9) - Primary    Has tried diet modification and moderate exercise, continue to follow low carb diet BMI - 36.78, started [facility] for weight loss, will increase dose as tolerated      Relevant Medications   Semaglutide-Weight Management 0.25 MG/0.5ML SOAJ   Semaglutide-Weight Management 0.5 MG/0.5ML SOAJ (Start on 12/16/2021)    Meds ordered this encounter  Medications   Semaglutide-Weight Management 0.25 MG/0.5ML SOAJ    Sig: Inject 0.25 mg into the skin once a week for 28 days.    Dispense:  2 mL    Refill:  0   Semaglutide-Weight Management 0.5 MG/0.5ML SOAJ    Sig: Inject 0.5 mg into the skin once a week for 28 days.    Dispense:  2 mL    Refill:  0    Follow-up: Return in about 2 months (around 01/15/2022) for Weight management.    Lindell Spar, MD

## 2021-11-17 NOTE — Assessment & Plan Note (Addendum)
Has tried diet modification and moderate exercise, continue to follow low carb diet BMI - 36.78, started [facility] for weight loss, will increase dose as tolerated

## 2021-11-17 NOTE — Patient Instructions (Signed)
Please start taking Wegovy as prescribed.  Please continue to follow low carb diet and perform moderate exercise/walking at least 150 mins/week. 

## 2021-11-17 NOTE — Assessment & Plan Note (Signed)
With CSF rhinorrhea Followed by ENT and Neurosurgeon S/p shunt placement Headaches better now, still has rhinorrhea, but manageable according to the patient - now on Topamax, followed by Neurology 

## 2021-11-18 ENCOUNTER — Encounter: Payer: Self-pay | Admitting: *Deleted

## 2021-11-24 ENCOUNTER — Other Ambulatory Visit (HOSPITAL_COMMUNITY): Payer: Self-pay

## 2021-11-26 ENCOUNTER — Ambulatory Visit
Admission: RE | Admit: 2021-11-26 | Discharge: 2021-11-26 | Disposition: A | Discharge status: Home / Self Care | Payer: No Typology Code available for payment source | Source: Ambulatory Visit | Attending: Obstetrics and Gynecology | Admitting: Obstetrics and Gynecology

## 2021-11-26 ENCOUNTER — Other Ambulatory Visit: Payer: Self-pay

## 2021-11-26 DIAGNOSIS — N644 Mastodynia: Secondary | ICD-10-CM

## 2021-11-26 IMAGING — US US BREAST*R* LIMITED INC AXILLA
1 series · 7 of 7 positions shown · non-contrast
Comparison: None.

CLINICAL DATA: 35-year-old female presenting for an area of focal
pain in the right breast which has now improved.

EXAM:
DIGITAL DIAGNOSTIC BILATERAL MAMMOGRAM WITH TOMOSYNTHESIS AND CAD;
ULTRASOUND RIGHT BREAST LIMITED
TECHNIQUE: Bilateral digital diagnostic mammography and breast tomosynthesis
was performed. The images were evaluated with computer-aided
detection.; Targeted ultrasound examination of the right breast was
performed

[Series 1: us breast*right* limited inc axilla · 0.06mm/px · 7 of 7 slices shown]
[im 1/7]
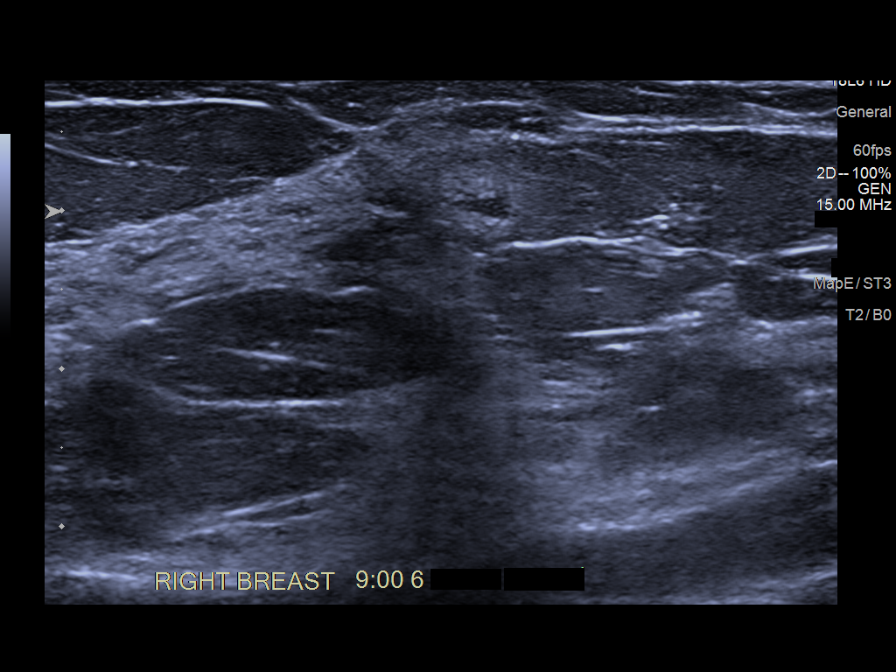
[im 2/7]
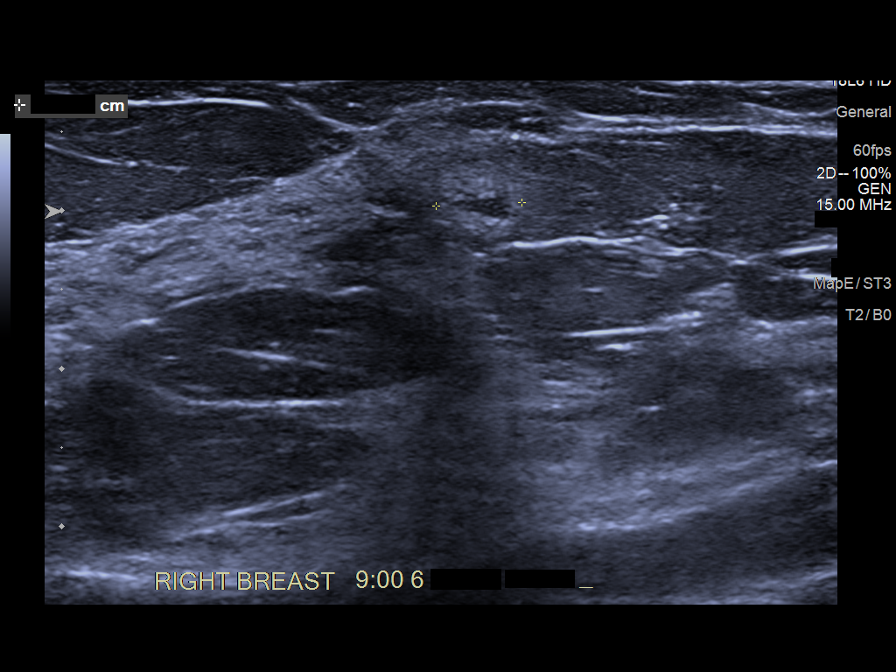
[im 3/7]
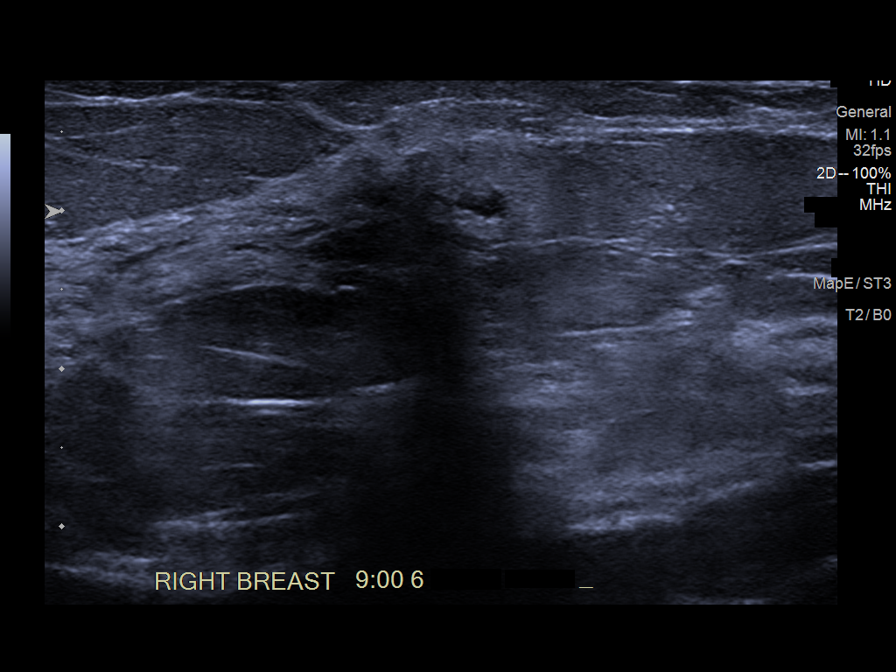
[im 4/7]
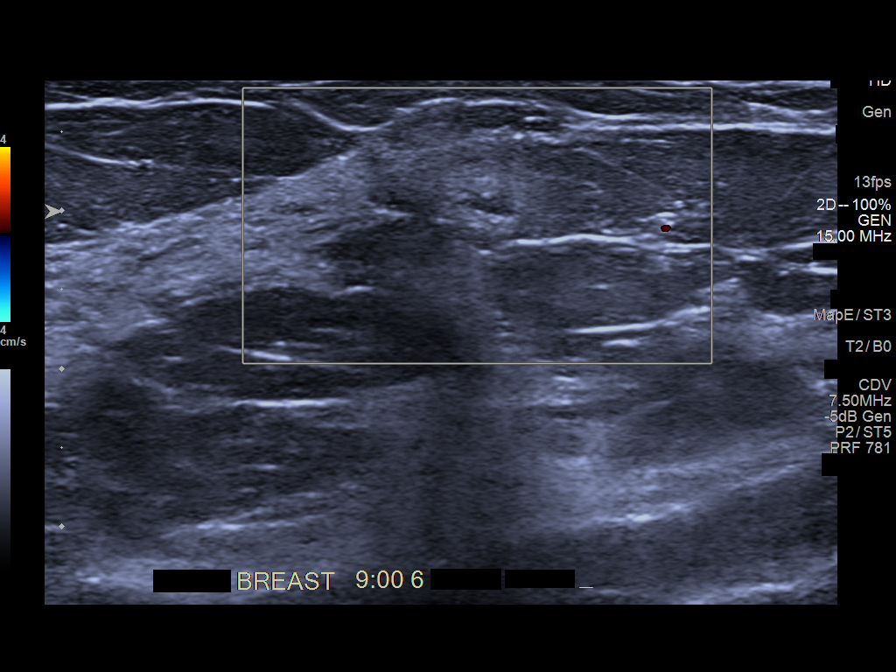
[im 5/7]
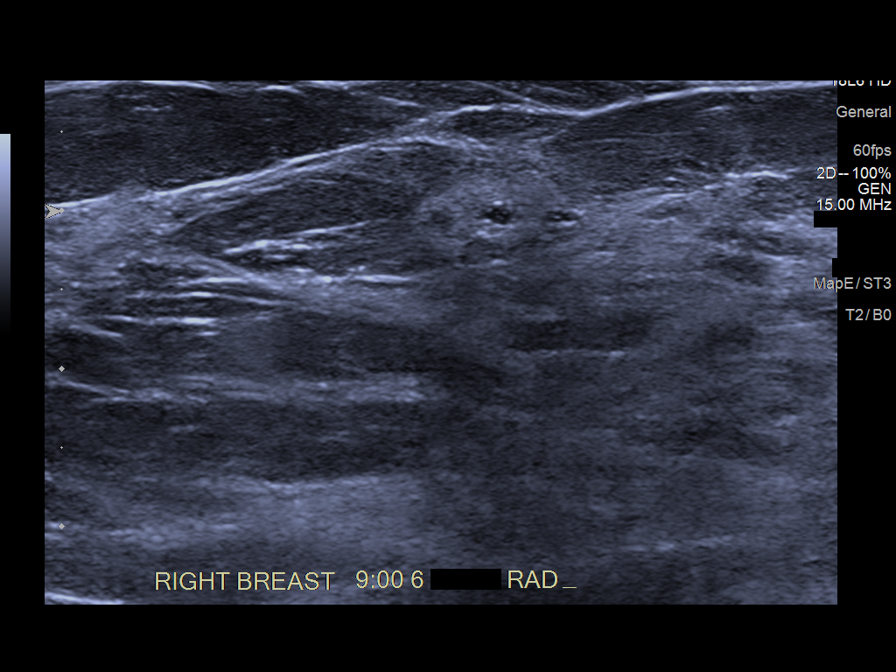
[im 6/7]
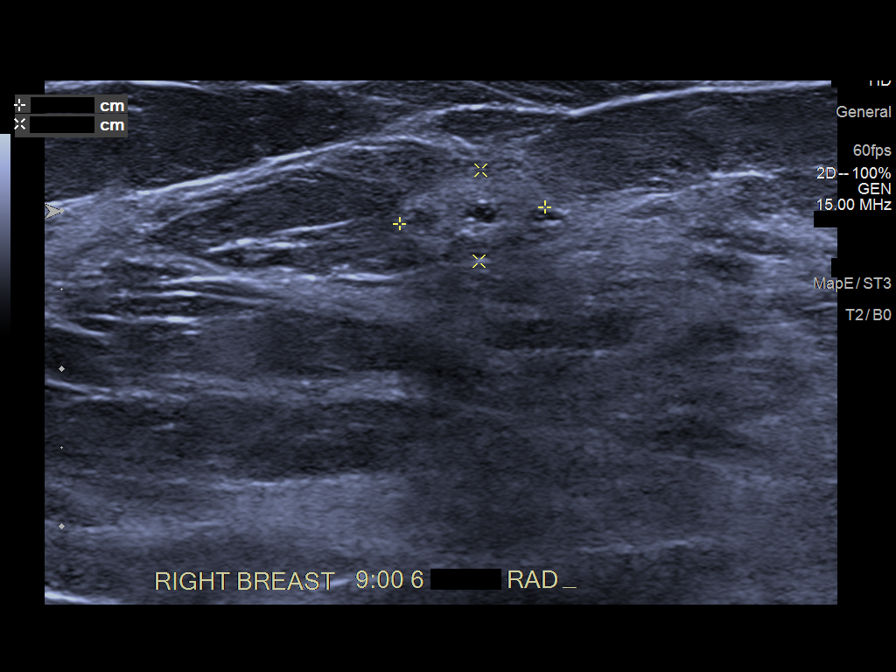
[im 7/7]
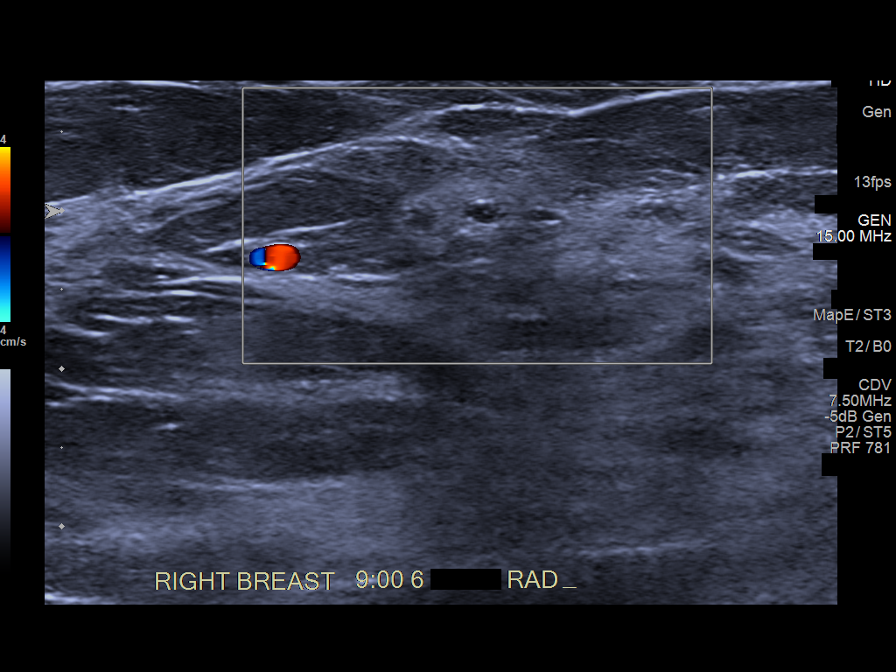

[7 of 7 positions shown; findings below may reference images not displayed]

ACR Breast Density Category c: The breast tissue is heterogeneously
dense, which may obscure small masses.
FINDINGS: Mammogram:

Right breast: A skin BB marks the site of focal pain reported by the
patient in the upper outer right breast. A spot tangential view of
this area was performed in addition to standard views. There is no
definite abnormality at the focal site of pain. Additionally a spot
compression tomosynthesis view of the slightly medial right breast
was performed for a questioned asymmetry which is less conspicuous
on the spot imaging and consistent with normal fibroglandular
tissue.

Left breast: Spot compression tomosynthesis MLO and full field mL
tomosynthesis views of the left breast were performed for a
questioned asymmetry seen only on MLO view in the superior left
breast. On the additional imaging the asymmetry resolves. There are
no suspicious findings to suggest malignancy in the left breast.

On physical exam at the site of focal pain reported by the patient
in the upper-outer right breast I do not feel a discrete mass or
focal area of thickening.

Ultrasound:

Targeted ultrasound performed in the right breast at 9 o'clock 6 cm
from the nipple at the site of pain reported by the patient
demonstrating a small area of increased echogenicity with central
cystic space, likely fat necrosis. This measures 0.9 x 0.6 x 0.5 cm.
No internal vascularity.
IMPRESSION: Probably benign mixed echogenicity area in the right breast at 9
o'clock at the site of pain reported by the patient, likely
representing fat necrosis.

RECOMMENDATION:
Right breast ultrasound in 3 months.

I have discussed the findings and recommendations with the patient.
If applicable, a reminder letter will be sent to the patient
regarding the next appointment.

BI-RADS CATEGORY  3: Probably benign.

## 2021-11-26 IMAGING — MG DIGITAL DIAGNOSTIC BILAT W/ TOMO W/ CAD
8 of 15 series · 8 of 40 positions shown · non-contrast
Comparison: None.

CLINICAL DATA: 35-year-old female presenting for an area of focal
pain in the right breast which has now improved.

EXAM:
DIGITAL DIAGNOSTIC BILATERAL MAMMOGRAM WITH TOMOSYNTHESIS AND CAD;
ULTRASOUND RIGHT BREAST LIMITED
TECHNIQUE: Bilateral digital diagnostic mammography and breast tomosynthesis
was performed. The images were evaluated with computer-aided
detection.; Targeted ultrasound examination of the right breast was
performed

[R CC synth-2D (1 of 2)]
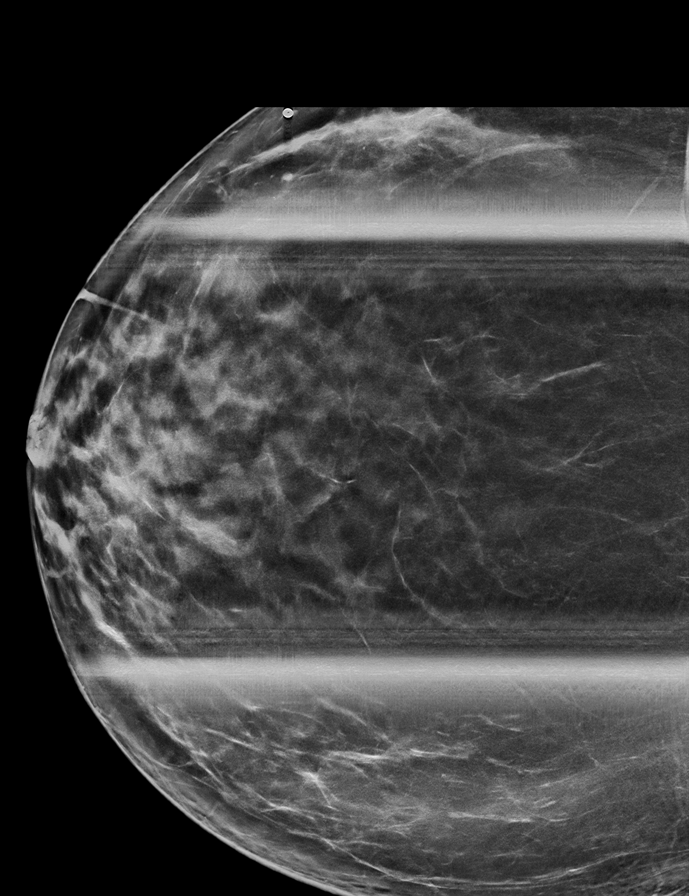

[R TAN synth-2D]
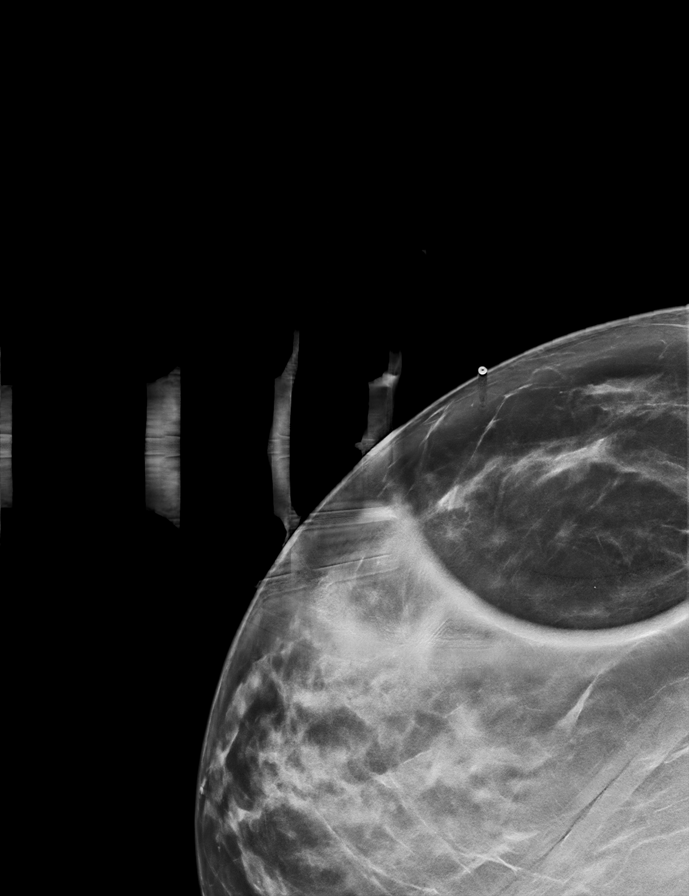

[L ML synth-2D]
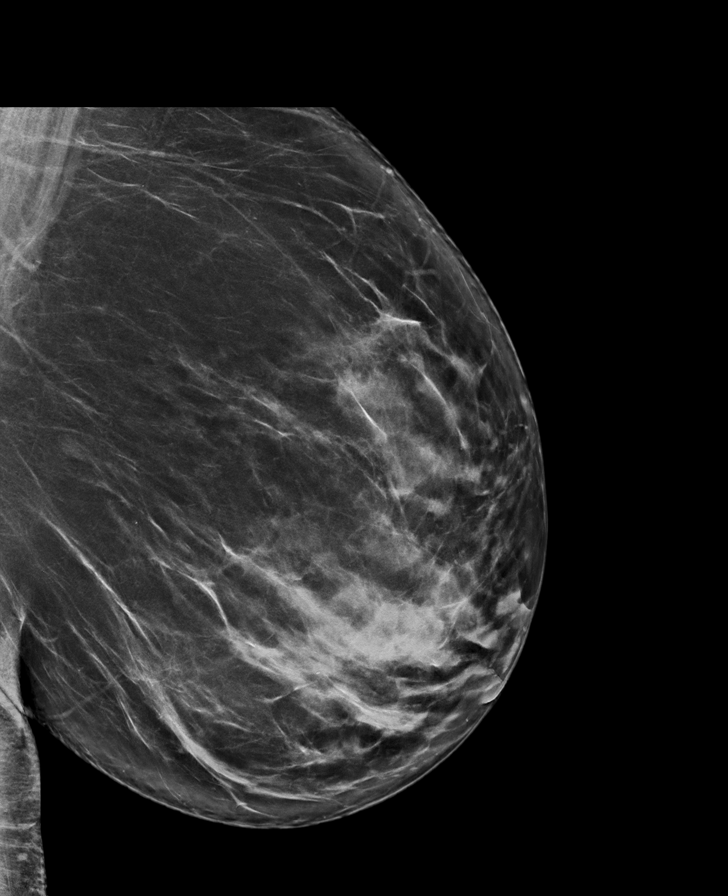

[L MLO synth-2D]
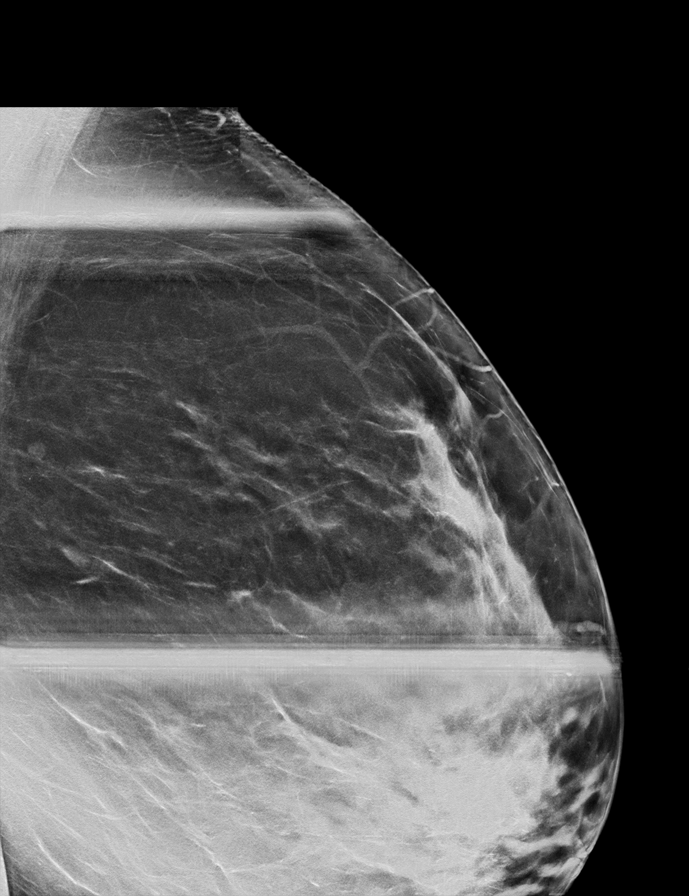

[L CC synth-2D]
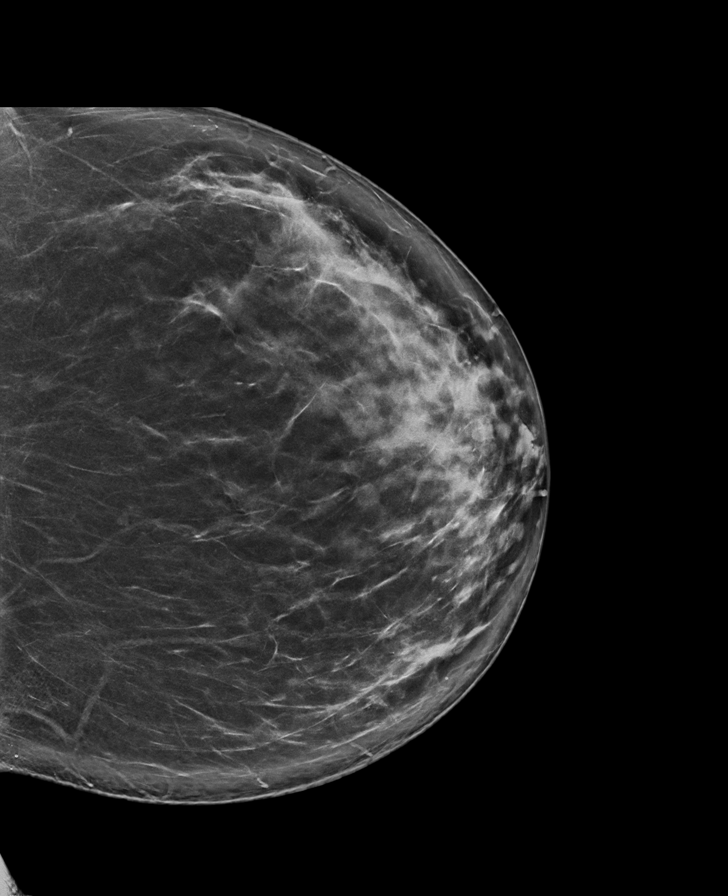

[R MLO synth-2D]
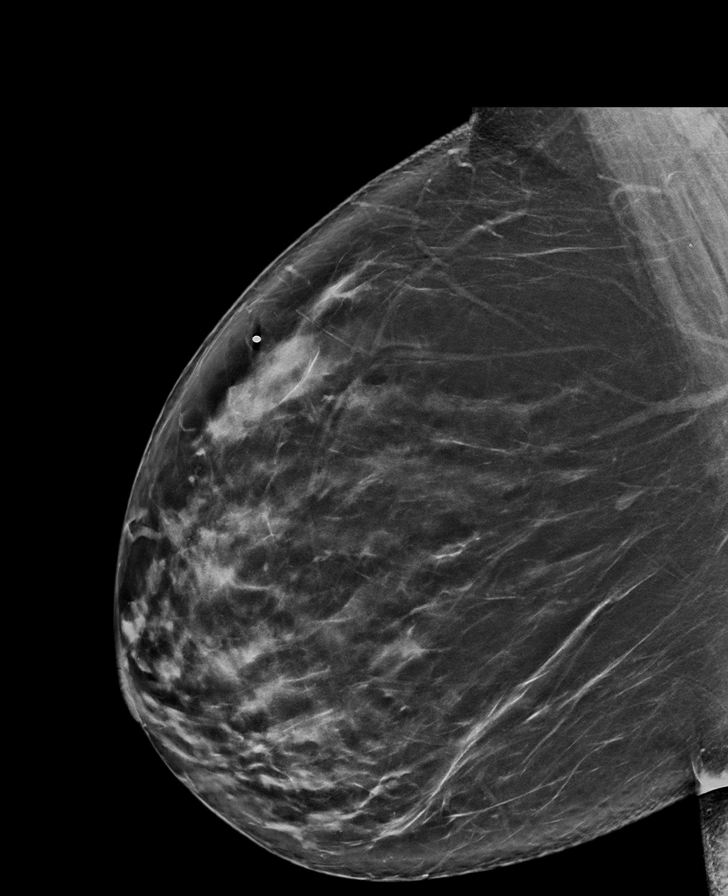

[R CC synth-2D (2 of 2)]
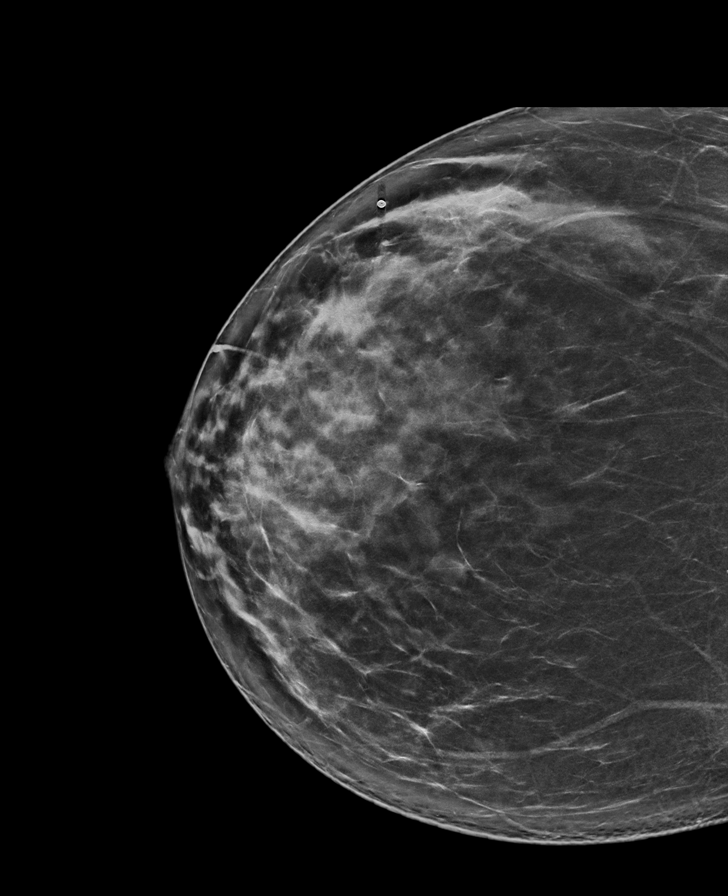

[L ML tomo · tomo slice 62/91.0]
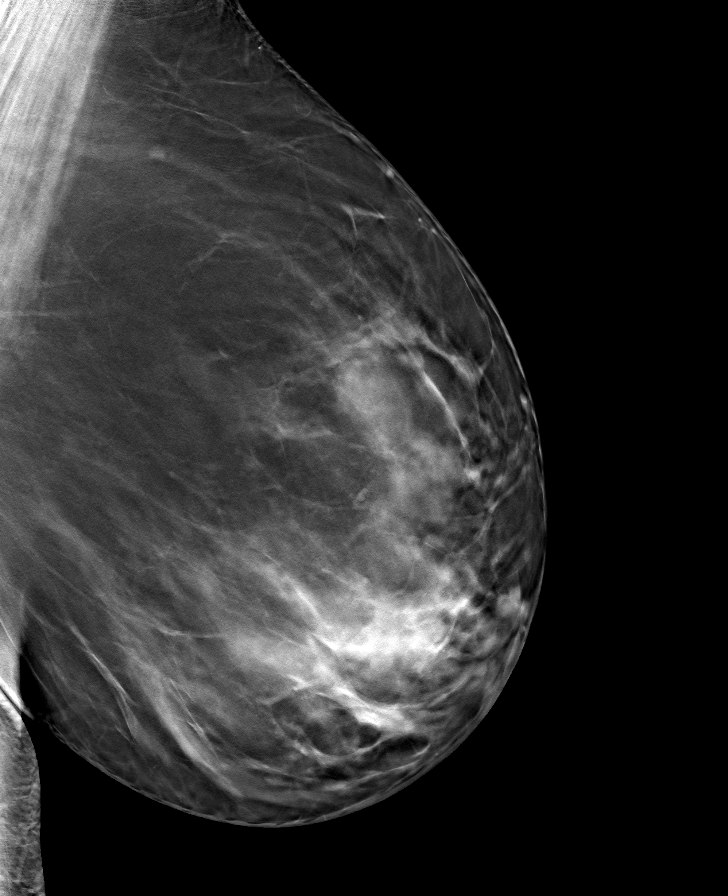

[8 of 40 positions shown; findings below may reference images not displayed]

ACR Breast Density Category c: The breast tissue is heterogeneously
dense, which may obscure small masses.
FINDINGS: Mammogram:

Right breast: A skin BB marks the site of focal pain reported by the
patient in the upper outer right breast. A spot tangential view of
this area was performed in addition to standard views. There is no
definite abnormality at the focal site of pain. Additionally a spot
compression tomosynthesis view of the slightly medial right breast
was performed for a questioned asymmetry which is less conspicuous
on the spot imaging and consistent with normal fibroglandular
tissue.

Left breast: Spot compression tomosynthesis MLO and full field mL
tomosynthesis views of the left breast were performed for a
questioned asymmetry seen only on MLO view in the superior left
breast. On the additional imaging the asymmetry resolves. There are
no suspicious findings to suggest malignancy in the left breast.

On physical exam at the site of focal pain reported by the patient
in the upper-outer right breast I do not feel a discrete mass or
focal area of thickening.

Ultrasound:

Targeted ultrasound performed in the right breast at 9 o'clock 6 cm
from the nipple at the site of pain reported by the patient
demonstrating a small area of increased echogenicity with central
cystic space, likely fat necrosis. This measures 0.9 x 0.6 x 0.5 cm.
No internal vascularity.
IMPRESSION: Probably benign mixed echogenicity area in the right breast at 9
o'clock at the site of pain reported by the patient, likely
representing fat necrosis.

RECOMMENDATION:
Right breast ultrasound in 3 months.

I have discussed the findings and recommendations with the patient.
If applicable, a reminder letter will be sent to the patient
regarding the next appointment.

BI-RADS CATEGORY  3: Probably benign.

## 2021-11-29 ENCOUNTER — Other Ambulatory Visit: Payer: Self-pay | Admitting: Obstetrics and Gynecology

## 2021-11-29 DIAGNOSIS — N6489 Other specified disorders of breast: Secondary | ICD-10-CM

## 2021-11-29 DIAGNOSIS — R928 Other abnormal and inconclusive findings on diagnostic imaging of breast: Secondary | ICD-10-CM

## 2021-12-17 ENCOUNTER — Other Ambulatory Visit (HOSPITAL_COMMUNITY): Payer: Self-pay

## 2022-01-07 ENCOUNTER — Encounter: Payer: Self-pay | Admitting: Internal Medicine

## 2022-01-07 ENCOUNTER — Ambulatory Visit (INDEPENDENT_AMBULATORY_CARE_PROVIDER_SITE_OTHER): Payer: No Typology Code available for payment source | Admitting: Internal Medicine

## 2022-01-07 ENCOUNTER — Other Ambulatory Visit (HOSPITAL_COMMUNITY): Payer: Self-pay

## 2022-01-07 VITALS — BP 112/70 | HR 97 | Ht 65.0 in | Wt 214.0 lb

## 2022-01-07 DIAGNOSIS — E669 Obesity, unspecified: Secondary | ICD-10-CM | POA: Diagnosis not present

## 2022-01-07 MED ORDER — SEMAGLUTIDE-WEIGHT MANAGEMENT 2.4 MG/0.75ML ~~LOC~~ SOAJ
2.4000 mg | SUBCUTANEOUS | 0 refills | Status: DC
  Filled 2022-01-07 – 2022-03-10 (×2): qty 3, 28d supply, fill #0

## 2022-01-07 MED ORDER — SEMAGLUTIDE-WEIGHT MANAGEMENT 1 MG/0.5ML ~~LOC~~ SOAJ
1.0000 mg | SUBCUTANEOUS | 0 refills | Status: AC
  Filled 2022-01-07: qty 2, 28d supply, fill #0

## 2022-01-07 MED ORDER — SEMAGLUTIDE-WEIGHT MANAGEMENT 1.7 MG/0.75ML ~~LOC~~ SOAJ
1.7000 mg | SUBCUTANEOUS | 0 refills | Status: AC
Start: 2022-02-04 — End: 2022-03-11
  Filled 2022-01-07 – 2022-02-09 (×2): qty 3, 28d supply, fill #0

## 2022-01-07 NOTE — Patient Instructions (Signed)
Please continue to follow low carb diet and perform moderate exercise/walking at least 150 mins/week. ? ?Please continue to take [facility] as prescribed. ?

## 2022-01-07 NOTE — Assessment & Plan Note (Signed)
BMI Readings from Last 3 Encounters:  ?01/07/22 35.61 kg/m?  ?11/17/21 36.78 kg/m?  ?09/16/21 37.31 kg/m?  ? ?Initial BMI - 36.78, started [facility] for weight loss, will increase dose as tolerated ?Diet modification and moderate exercise, continue to follow low carb diet ? ?

## 2022-01-07 NOTE — Progress Notes (Signed)
? ?Established Patient Office Visit ? ?Subjective:  ?Patient ID: [name], female    DOB: 07-08-1986  Age: 36 y.o. MRN: [id number] ? ?CC:  ?Chief Complaint  ?Patient presents with  ? Follow-up  ?  2 month f/u weight  ? ? ?HPI ?Paula Burgess is a 36 y.o. female with past medical history of IIH s/p shunt placement, CSF rhinorrhea and tobacco abuse who presents for f/u of her chronic medical conditions. ? ?Obesity: She has lost about 7 lbs in 7 weeks since starting Wegovy.  She has been trying to follow low-carb diet and exercises regularly.  She had mild nausea when she took the first dose of Wegovy, but denies it now.  She has mild constipation due to it, but is manageable for now.  She denies any bloating, abdominal pain or diarrhea. ? ?Past Medical History:  ?Diagnosis Date  ? GERD (gastroesophageal reflux disease)   ? Intracranial hypertension   ? ? ?Past Surgical History:  ?Procedure Laterality Date  ? ceribrial stent    ? CESAREAN SECTION  2012  ? CESAREAN SECTION N/A 02/12/2014  ? Procedure: Repeat CESAREAN SECTION;  Surgeon: Lovenia Kim, MD;  Location: [facility];  Service: Obstetrics;  Laterality: N/A;  ? DILATION AND EVACUATION N/A 01/23/2021  ? Procedure: DILATATION AND EVACUATION;  Surgeon: Armandina Stammer, DO;  Location: [place];  Service: Gynecology;  Laterality: N/A;  ? ? ?Family History  ?Problem Relation Age of Onset  ? Healthy Mother   ? Healthy Father   ? Breast cancer Maternal Grandmother   ? Diabetes Maternal Grandmother   ? Diabetes Maternal Grandfather   ? Diabetes Paternal Grandmother   ? Diabetes Paternal Grandfather   ? Migraines Neg Hx   ? Pseudotumor cerebri Neg Hx   ? Heart failure Neg Hx   ? ? ?Social History  ? ?Socioeconomic History  ? Marital status: Divorced  ?  Spouse name: Not on file  ? Number of children: 2  ? Years of education: Not on file  ? Highest education level: Associate degree: occupational, Hotel manager, or vocational program  ?Occupational History  ? Not on file   ?Tobacco Use  ? Smoking status: Every Day  ?  Packs/day: 0.50  ?  Years: 15.00  ?  Pack years: 7.50  ?  Types: Cigarettes  ?  Start date: 2006  ? Smokeless tobacco: Never  ?Vaping Use  ? Vaping Use: Never used  ?Substance and Sexual Activity  ? Alcohol use: Yes  ?  Comment: occ  ? Drug use: No  ? Sexual activity: Not on file  ?Other Topics Concern  ? Not on file  ?Social History Narrative  ? ** Merged History Encounter **  ?    ? Lives with her children ?Right handed ?Caffeine: maybe 3 cups/day  ? ?Social Determinants of Health  ? ?Financial Resource Strain: Not on file  ?Food Insecurity: Not on file  ?Transportation Needs: Not on file  ?Physical Activity: Not on file  ?Stress: Not on file  ?Social Connections: Not on file  ?Intimate Partner Violence: Not on file  ? ? ?Outpatient Medications Prior to Visit  ?Medication Sig Dispense Refill  ? Semaglutide-Weight Management 0.5 MG/0.5ML SOAJ Inject 0.5 mg into the skin once a week for 28 days. 2 mL 0  ? topiramate (TOPAMAX) 25 MG tablet Start with 1 tablet by mouth at bedtime. May increase to 2 tablets if needed. 180 tablet 3  ? rizatriptan (MAXALT-MLT) 10 MG  disintegrating tablet Dissolve 1 tablet (10 mg total) by mouth as needed for migraine. May repeat in 2 hours if needed (Patient not taking: Reported on 11/17/2021) 9 tablet 11  ? ?No facility-administered medications prior to visit.  ? ? ?No Known Allergies ? ?ROS ?Review of Systems  ?Constitutional:  Negative for chills and fever.  ?HENT:  Positive for rhinorrhea. Negative for congestion, sinus pressure, sinus pain and sore throat.   ?Eyes:  Negative for pain and discharge.  ?Respiratory:  Negative for cough and shortness of breath.   ?Cardiovascular:  Negative for chest pain and palpitations.  ?Gastrointestinal:  Negative for abdominal pain, diarrhea, nausea and vomiting.  ?Endocrine: Negative for polydipsia and polyuria.  ?Genitourinary:  Negative for dysuria and hematuria.  ?Musculoskeletal:  Negative for neck  pain and neck stiffness.  ?Skin:  Negative for rash.  ?Neurological:  Positive for headaches. Negative for weakness.  ?Psychiatric/Behavioral:  Negative for agitation and behavioral problems.   ? ?  ?Objective:  ?  ?Physical Exam ?Vitals reviewed.  ?Constitutional:   ?   General: She is not in acute distress. ?   Appearance: She is not diaphoretic.  ?HENT:  ?   Head: Normocephalic.  ?   Nose: Nose normal.  ?   Mouth/Throat:  ?   Mouth: Mucous membranes are moist.  ?Eyes:  ?   General: No scleral icterus. ?   Extraocular Movements: Extraocular movements intact.  ?Neck:  ?   Vascular: No carotid bruit.  ?Cardiovascular:  ?   Rate and Rhythm: Normal rate and regular rhythm.  ?   Pulses: Normal pulses.  ?   Heart sounds: Normal heart sounds. No murmur heard. ?Pulmonary:  ?   Breath sounds: Normal breath sounds. No wheezing or rales.  ?Musculoskeletal:  ?   Cervical back: Neck supple. No tenderness.  ?   Right lower leg: No edema.  ?   Left lower leg: No edema.  ?Skin: ?   General: Skin is warm.  ?   Findings: No rash.  ?Neurological:  ?   General: No focal deficit present.  ?   Mental Status: She is alert and oriented to person, place, and time.  ?   Sensory: No sensory deficit.  ?   Motor: No weakness.  ?Psychiatric:     ?   Mood and Affect: Mood normal.     ?   Behavior: Behavior normal.  ? ? ?BP 112/70 (BP Location: Right Arm, Patient Position: Sitting, Cuff Size: Normal)   Pulse 97   Ht '5\' 5"'  (1.651 m)   Wt 214 lb (97.1 kg)   LMP  (LMP Unknown)   SpO2 97%   BMI 35.61 kg/m?  ?Wt Readings from Last 3 Encounters:  ?01/07/22 214 lb (97.1 kg)  ?11/17/21 221 lb (100.2 kg)  ?09/16/21 224 lb 3.2 oz (101.7 kg)  ? ? ?Lab Results  ?Component Value Date  ? TSH 1.920 06/08/2021  ? ?Lab Results  ?Component Value Date  ? WBC 6.1 06/08/2021  ? HGB 11.2 06/08/2021  ? HCT 36.4 06/08/2021  ? MCV 76 (L) 06/08/2021  ? PLT 319 06/08/2021  ? ?Lab Results  ?Component Value Date  ? NA 137 06/08/2021  ? K 4.1 06/08/2021  ? CO2 21  06/08/2021  ? GLUCOSE 101 (H) 06/08/2021  ? BUN 10 06/08/2021  ? CREATININE 0.85 06/08/2021  ? BILITOT <0.2 06/08/2021  ? ALKPHOS 65 06/08/2021  ? AST 16 06/08/2021  ? ALT 15 06/08/2021  ? PROT  6.7 06/08/2021  ? ALBUMIN 4.6 06/08/2021  ? CALCIUM 8.9 06/08/2021  ? EGFR 91 06/08/2021  ? ?Lab Results  ?Component Value Date  ? CHOL 157 06/08/2021  ? ?Lab Results  ?Component Value Date  ? HDL 50 06/08/2021  ? ?Lab Results  ?Component Value Date  ? [address] 91 06/08/2021  ? ?Lab Results  ?Component Value Date  ? TRIG 83 06/08/2021  ? ?Lab Results  ?Component Value Date  ? CHOLHDL 3.1 06/08/2021  ? ?Lab Results  ?Component Value Date  ? HGBA1C 5.4 06/08/2021  ? ? ?  ?Assessment & Plan:  ? ?Problem List Items Addressed This Visit   ? ?  ? Other  ? Obesity (BMI 30-39.9) - Primary  ?  BMI Readings from Last 3 Encounters:  ?01/07/22 35.61 kg/m?  ?11/17/21 36.78 kg/m?  ?09/16/21 37.31 kg/m?  ?Initial BMI - 36.78, started [facility] for weight loss, will increase dose as tolerated ?Diet modification and moderate exercise, continue to follow low carb diet ? ?  ?  ? Relevant Medications  ? Semaglutide-Weight Management 1 MG/0.5ML SOAJ  ? Semaglutide-Weight Management 1.7 MG/0.75ML SOAJ (Start on 02/04/2022)  ? Semaglutide-Weight Management 2.4 MG/0.75ML SOAJ (Start on 03/04/2022)  ? ? ?Meds ordered this encounter  ?Medications  ? Semaglutide-Weight Management 1 MG/0.5ML SOAJ  ?  Sig: Inject 1 mg into the skin once a week for 28 days.  ?  Dispense:  2 mL  ?  Refill:  0  ? Semaglutide-Weight Management 1.7 MG/0.75ML SOAJ  ?  Sig: Inject 1.7 mg into the skin once a week for 28 days.  ?  Dispense:  3 mL  ?  Refill:  0  ? Semaglutide-Weight Management 2.4 MG/0.75ML SOAJ  ?  Sig: Inject 2.4 mg into the skin once a week for 28 days.  ?  Dispense:  3 mL  ?  Refill:  0  ? ? ?Follow-up: Return in about 3 months (around 04/08/2022) for Weight management.  ? ? ?Lindell Spar, MD ?

## 2022-01-10 ENCOUNTER — Other Ambulatory Visit (HOSPITAL_COMMUNITY): Payer: Self-pay

## 2022-02-10 ENCOUNTER — Other Ambulatory Visit (HOSPITAL_COMMUNITY): Payer: Self-pay

## 2022-02-11 ENCOUNTER — Other Ambulatory Visit (HOSPITAL_COMMUNITY): Payer: Self-pay

## 2022-03-10 ENCOUNTER — Other Ambulatory Visit (HOSPITAL_COMMUNITY): Payer: Self-pay

## 2022-03-14 ENCOUNTER — Other Ambulatory Visit (HOSPITAL_COMMUNITY): Payer: Self-pay

## 2022-03-16 ENCOUNTER — Other Ambulatory Visit (HOSPITAL_COMMUNITY): Payer: Self-pay

## 2022-03-16 ENCOUNTER — Telehealth: Payer: Self-pay | Admitting: Internal Medicine

## 2022-03-16 NOTE — Telephone Encounter (Signed)
PA has been sent to pharmacy 03-16-22 Please let patient know

## 2022-03-16 NOTE — Telephone Encounter (Signed)
Pt states phar has had an issue with the phar in regards to the wegovy medication. A PA was done February 2023 and phar does not have on file anymore since computers reset. They were supposed to have resent for it last week. Can you please look into this?   [place]

## 2022-03-22 ENCOUNTER — Other Ambulatory Visit (HOSPITAL_COMMUNITY): Payer: Self-pay

## 2022-04-08 ENCOUNTER — Other Ambulatory Visit (HOSPITAL_COMMUNITY): Payer: Self-pay

## 2022-04-08 ENCOUNTER — Ambulatory Visit (INDEPENDENT_AMBULATORY_CARE_PROVIDER_SITE_OTHER): Payer: No Typology Code available for payment source | Admitting: Internal Medicine

## 2022-04-08 ENCOUNTER — Encounter: Payer: Self-pay | Admitting: Internal Medicine

## 2022-04-08 VITALS — BP 124/82 | HR 87 | Resp 18 | Ht 65.0 in | Wt 199.4 lb

## 2022-04-08 DIAGNOSIS — G932 Benign intracranial hypertension: Secondary | ICD-10-CM | POA: Diagnosis not present

## 2022-04-08 DIAGNOSIS — E559 Vitamin D deficiency, unspecified: Secondary | ICD-10-CM

## 2022-04-08 DIAGNOSIS — Z720 Tobacco use: Secondary | ICD-10-CM | POA: Diagnosis not present

## 2022-04-08 DIAGNOSIS — E669 Obesity, unspecified: Secondary | ICD-10-CM

## 2022-04-08 DIAGNOSIS — Z0000 Encounter for general adult medical examination without abnormal findings: Secondary | ICD-10-CM

## 2022-04-08 MED ORDER — SEMAGLUTIDE-WEIGHT MANAGEMENT 2.4 MG/0.75ML ~~LOC~~ SOAJ
2.4000 mg | SUBCUTANEOUS | 5 refills | Status: DC
  Filled 2022-04-08: qty 3, 28d supply, fill #0
  Filled 2022-05-13: qty 3, 28d supply, fill #1
  Filled 2022-06-09: qty 3, 28d supply, fill #2
  Filled 2022-07-08: qty 3, 28d supply, fill #3
  Filled 2022-08-04: qty 3, 28d supply, fill #4
  Filled 2022-08-31: qty 3, 28d supply, fill #5

## 2022-04-08 NOTE — Progress Notes (Signed)
Established Patient Office Visit  Subjective:  Patient ID: Paula Burgess, female    DOB: 1986/07/30  Age: 36 y.o. MRN: [id number]  CC:  Chief Complaint  Patient presents with   Follow-up    3 month follow up weight management pt has lost 15lbs since March     HPI Paula Burgess is a 36 y.o. female with past medical history of IIH s/p shunt placement, CSF rhinorrhea and tobacco abuse who presents for f/u of her chronic medical conditions.  Obesity: She has lost about 22 lbs since starting Wegovy.  She has been trying to follow low-carb diet and exercises regularly.  She had mild nausea when she took the first dose of Wegovy, but denies it now. She denies any bloating, abdominal pain or diarrhea.      Past Medical History:  Diagnosis Date   GERD (gastroesophageal reflux disease)    Intracranial hypertension     Past Surgical History:  Procedure Laterality Date   ceribrial stent     CESAREAN SECTION  2012   CESAREAN SECTION N/A 02/12/2014   Procedure: Repeat CESAREAN SECTION;  Surgeon: Lovenia Kim, MD;  Location: [facility];  Service: Obstetrics;  Laterality: N/A;   DILATION AND EVACUATION N/A 01/23/2021   Procedure: DILATATION AND EVACUATION;  Surgeon: Armandina Stammer, DO;  Location: [facility];  Service: Gynecology;  Laterality: N/A;    Family History  Problem Relation Age of Onset   Healthy Mother    Healthy Father    Breast cancer Maternal Grandmother    Diabetes Maternal Grandmother    Diabetes Maternal Grandfather    Diabetes Paternal Grandmother    Diabetes Paternal Grandfather    Migraines Neg Hx    Pseudotumor cerebri Neg Hx    Heart failure Neg Hx     Social History   Socioeconomic History   Marital status: Divorced    Spouse name: Not on file   Number of children: 2   Years of education: Not on file   Highest education level: Associate degree: occupational, Hotel manager, or vocational program  Occupational History   Not on file  Tobacco Use    Smoking status: Every Day    Packs/day: 0.50    Years: 15.00    Total pack years: 7.50    Types: Cigarettes    Start date: 2006   Smokeless tobacco: Never  Vaping Use   Vaping Use: Never used  Substance and Sexual Activity   Alcohol use: Yes    Comment: occ   Drug use: No   Sexual activity: Not on file  Other Topics Concern   Not on file  Social History Narrative   ** Merged History Encounter **       Lives with her children Right handed Caffeine: maybe 3 cups/day   Social Determinants of Health   Financial Resource Strain: Not on file  Food Insecurity: Not on file  Transportation Needs: Not on file  Physical Activity: Not on file  Stress: Not on file  Social Connections: Not on file  Intimate Partner Violence: Not on file    Outpatient Medications Prior to Visit  Medication Sig Dispense Refill   rizatriptan (MAXALT-MLT) 10 MG disintegrating tablet Dissolve 1 tablet (10 mg total) by mouth as needed for migraine. May repeat in 2 hours if needed 9 tablet 11   topiramate (TOPAMAX) 25 MG tablet Start with 1 tablet by mouth at bedtime. May increase to 2 tablets if needed. 180 tablet 3  Semaglutide-Weight Management 2.4 MG/0.75ML SOAJ Inject 2.4 mg into the skin once a week for 28 days. 3 mL 0   No facility-administered medications prior to visit.    No Known Allergies  ROS Review of Systems  Constitutional:  Negative for chills and fever.  HENT:  Negative for congestion, sinus pressure, sinus pain and sore throat.   Eyes:  Negative for pain and discharge.  Respiratory:  Negative for cough and shortness of breath.   Cardiovascular:  Negative for chest pain and palpitations.  Gastrointestinal:  Negative for abdominal pain, diarrhea, nausea and vomiting.  Endocrine: Negative for polydipsia and polyuria.  Genitourinary:  Negative for dysuria and hematuria.  Musculoskeletal:  Negative for neck pain and neck stiffness.  Skin:  Negative for rash.  Neurological:  Negative  for weakness and headaches.  Psychiatric/Behavioral:  Negative for agitation and behavioral problems.       Objective:    Physical Exam Vitals reviewed.  Constitutional:      General: She is not in acute distress.    Appearance: She is not diaphoretic.  HENT:     Head: Normocephalic.     Nose: Nose normal.     Mouth/Throat:     Mouth: Mucous membranes are moist.  Eyes:     General: No scleral icterus.    Extraocular Movements: Extraocular movements intact.  Cardiovascular:     Rate and Rhythm: Normal rate and regular rhythm.     Pulses: Normal pulses.     Heart sounds: Normal heart sounds. No murmur heard. Pulmonary:     Breath sounds: Normal breath sounds. No wheezing or rales.  Musculoskeletal:     Cervical back: Neck supple. No tenderness.     Right lower leg: No edema.     Left lower leg: No edema.  Skin:    General: Skin is warm.     Findings: No rash.  Neurological:     General: No focal deficit present.     Mental Status: She is alert and oriented to person, place, and time.     Sensory: No sensory deficit.     Motor: No weakness.  Psychiatric:        Mood and Affect: Mood normal.        Behavior: Behavior normal.     BP 124/82 (BP Location: Left Arm, Patient Position: Sitting, Cuff Size: Normal)   Pulse 87   Resp 18   Ht '5\' 5"'  (1.651 m)   Wt 199 lb 6.4 oz (90.4 kg)   SpO2 97%   BMI 33.18 kg/m  Wt Readings from Last 3 Encounters:  04/08/22 199 lb 6.4 oz (90.4 kg)  01/07/22 214 lb (97.1 kg)  11/17/21 221 lb (100.2 kg)    Lab Results  Component Value Date   TSH 1.920 06/08/2021   Lab Results  Component Value Date   WBC 6.1 06/08/2021   HGB 11.2 06/08/2021   HCT 36.4 06/08/2021   MCV 76 (L) 06/08/2021   PLT 319 06/08/2021   Lab Results  Component Value Date   NA 137 06/08/2021   K 4.1 06/08/2021   CO2 21 06/08/2021   GLUCOSE 101 (H) 06/08/2021   BUN 10 06/08/2021   CREATININE 0.85 06/08/2021   BILITOT <0.2 06/08/2021   ALKPHOS 65  06/08/2021   AST 16 06/08/2021   ALT 15 06/08/2021   PROT 6.7 06/08/2021   ALBUMIN 4.6 06/08/2021   CALCIUM 8.9 06/08/2021   EGFR 91 06/08/2021   Lab Results  Component Value Date   CHOL 157 06/08/2021   Lab Results  Component Value Date   HDL 50 06/08/2021   Lab Results  Component Value Date   LDLCALC 91 06/08/2021   Lab Results  Component Value Date   TRIG 83 06/08/2021   Lab Results  Component Value Date   CHOLHDL 3.1 06/08/2021   Lab Results  Component Value Date   HGBA1C 5.4 06/08/2021      Assessment & Plan:   Problem List Items Addressed This Visit       Nervous and Auditory   IIH (idiopathic intracranial hypertension)    With CSF rhinorrhea Followed by ENT and Neurosurgeon S/p shunt placement Headaches better now, still has rhinorrhea, but manageable according to the patient - now on Topamax, followed by Neurology        Other   Annual physical exam   Relevant Orders   TSH   Lipid panel   CMP14+EGFR   CBC with Differential/Platelet   Tobacco abuse    Smokes about 0.5 pack/day  Asked about quitting: confirms that she currently smokes cigarettes Advise to quit smoking: Educated about QUITTING to reduce the risk of cancer, cardio and cerebrovascular disease. Assess willingness: Unwilling to quit at this time, but is working on cutting back. Assist with counseling and pharmacotherapy: Counseled for 5 minutes and literature provided. Arrange for follow up: Follow up in 3 months and continue to offer help.      Obesity (BMI 30-39.9) - Primary    BMI Readings from Last 3 Encounters:  04/08/22 33.18 kg/m  01/07/22 35.61 kg/m  11/17/21 36.78 kg/m  Initial BMI - 36.78 - on Wegovy for weight loss Diet modification and moderate exercise, continue to follow low carb diet Has lost 15 lbs since the last visit      Relevant Medications   Semaglutide-Weight Management 2.4 MG/0.75ML SOAJ   Other Relevant Orders   Hemoglobin A1c   CMP14+EGFR    Other Visit Diagnoses     Vitamin D deficiency       Relevant Orders   VITAMIN D 25 Hydroxy (Vit-D Deficiency, Fractures)       Meds ordered this encounter  Medications   Semaglutide-Weight Management 2.4 MG/0.75ML SOAJ    Sig: Inject 2.4 mg into the skin once a week.    Dispense:  3 mL    Refill:  5    Follow-up: Return in about 4 months (around 08/08/2022) for Annual physical.    Lindell Spar, MD

## 2022-04-08 NOTE — Assessment & Plan Note (Signed)
Smokes about 0.5 pack/day  Asked about quitting: confirms that she currently smokes cigarettes Advise to quit smoking: Educated about QUITTING to reduce the risk of cancer, cardio and cerebrovascular disease. Assess willingness: Unwilling to quit at this time, but is working on cutting back. Assist with counseling and pharmacotherapy: Counseled for 5 minutes and literature provided. Arrange for follow up: Follow up in 3 months and continue to offer help. 

## 2022-04-08 NOTE — Patient Instructions (Addendum)
Please continue taking medications as prescribed.  Please continue to follow low carb diet and perform moderate exercise/walking at least 150 mins/week.  Please try to cut down -> quit smoking.  Please get fasting blood tests done before the next visit.

## 2022-04-08 NOTE — Assessment & Plan Note (Signed)
With CSF rhinorrhea Followed by ENT and Neurosurgeon S/p shunt placement Headaches better now, still has rhinorrhea, but manageable according to the patient - now on Topamax, followed by Neurology

## 2022-04-08 NOTE — Assessment & Plan Note (Addendum)
BMI Readings from Last 3 Encounters:  04/08/22 33.18 kg/m  01/07/22 35.61 kg/m  11/17/21 36.78 kg/m   Initial BMI - 36.78 - on Wegovy for weight loss Diet modification and moderate exercise, continue to follow low carb diet Has lost 15 lbs since the last visit

## 2022-04-13 ENCOUNTER — Other Ambulatory Visit (HOSPITAL_COMMUNITY): Payer: Self-pay

## 2022-04-13 ENCOUNTER — Ambulatory Visit: Payer: No Typology Code available for payment source | Admitting: Internal Medicine

## 2022-04-18 ENCOUNTER — Other Ambulatory Visit (HOSPITAL_COMMUNITY): Payer: Self-pay

## 2022-05-13 ENCOUNTER — Other Ambulatory Visit (HOSPITAL_COMMUNITY): Payer: Self-pay

## 2022-06-09 ENCOUNTER — Other Ambulatory Visit (HOSPITAL_COMMUNITY): Payer: Self-pay

## 2022-07-08 ENCOUNTER — Other Ambulatory Visit (HOSPITAL_COMMUNITY): Payer: Self-pay

## 2022-07-13 LAB — HEMOGLOBIN A1C
Est. average glucose Bld gHb Est-mCnc: 103 mg/dL
Hgb A1c MFr Bld: 5.2 % (ref 4.8–5.6)

## 2022-07-13 LAB — CMP14+EGFR
ALT: 10 IU/L (ref 0–32)
AST: 13 IU/L (ref 0–40)
Albumin/Globulin Ratio: 2.4 — ABNORMAL HIGH (ref 1.2–2.2)
Albumin: 4.5 g/dL (ref 3.9–4.9)
Alkaline Phosphatase: 52 IU/L (ref 44–121)
BUN/Creatinine Ratio: 13 (ref 9–23)
BUN: 10 mg/dL (ref 6–20)
Bilirubin Total: 0.3 mg/dL (ref 0.0–1.2)
CO2: 20 mmol/L (ref 20–29)
Calcium: 9.7 mg/dL (ref 8.7–10.2)
Chloride: 101 mmol/L (ref 96–106)
Creatinine, Ser: 0.75 mg/dL (ref 0.57–1.00)
Globulin, Total: 1.9 g/dL (ref 1.5–4.5)
Glucose: 88 mg/dL (ref 70–99)
Potassium: 4.3 mmol/L (ref 3.5–5.2)
Sodium: 136 mmol/L (ref 134–144)
Total Protein: 6.4 g/dL (ref 6.0–8.5)
eGFR: 105 mL/min/{1.73_m2} (ref >59)

## 2022-07-13 LAB — VITAMIN D 25 HYDROXY (VIT D DEFICIENCY, FRACTURES): Vit D, 25-Hydroxy: 32.9 ng/mL (ref 30.0–100.0)

## 2022-07-13 LAB — CBC WITH DIFFERENTIAL/PLATELET
Basophils Absolute: 0 10*3/uL (ref 0.0–0.2)
Basos: 0 %
EOS (ABSOLUTE): 0.1 10*3/uL (ref 0.0–0.4)
Eos: 1 %
Hematocrit: 42.1 % (ref 34.0–46.6)
Hemoglobin: 13.8 g/dL (ref 11.1–15.9)
Immature Grans (Abs): 0 10*3/uL (ref 0.0–0.1)
Immature Granulocytes: 0 %
Lymphocytes Absolute: 2.8 10*3/uL (ref 0.7–3.1)
Lymphs: 30 %
MCH: 30 pg (ref 26.6–33.0)
MCHC: 32.8 g/dL (ref 31.5–35.7)
MCV: 92 fL (ref 79–97)
Monocytes Absolute: 0.6 10*3/uL (ref 0.1–0.9)
Monocytes: 7 %
Neutrophils Absolute: 5.7 10*3/uL (ref 1.4–7.0)
Neutrophils: 62 %
Platelets: 306 10*3/uL (ref 150–450)
RBC: 4.6 x10E6/uL (ref 3.77–5.28)
RDW: 13.4 % (ref 11.7–15.4)
WBC: 9.3 10*3/uL (ref 3.4–10.8)

## 2022-07-13 LAB — LIPID PANEL
Chol/HDL Ratio: 3.8 ratio (ref 0.0–4.4)
Cholesterol, Total: 173 mg/dL (ref 100–199)
HDL: 45 mg/dL (ref >39)
LDL Chol Calc (NIH): 115 mg/dL — ABNORMAL HIGH (ref 0–99)
Triglycerides: 65 mg/dL (ref 0–149)
VLDL Cholesterol Cal: 13 mg/dL (ref 5–40)

## 2022-07-13 LAB — TSH: TSH: 0.974 u[IU]/mL (ref 0.450–4.500)

## 2022-08-04 ENCOUNTER — Other Ambulatory Visit (HOSPITAL_COMMUNITY): Payer: Self-pay

## 2022-08-09 ENCOUNTER — Ambulatory Visit (INDEPENDENT_AMBULATORY_CARE_PROVIDER_SITE_OTHER): Payer: No Typology Code available for payment source | Admitting: Internal Medicine

## 2022-08-09 ENCOUNTER — Encounter: Payer: Self-pay | Admitting: Internal Medicine

## 2022-08-09 VITALS — BP 112/84 | HR 77 | Resp 16 | Ht 65.0 in | Wt 188.0 lb

## 2022-08-09 DIAGNOSIS — E782 Mixed hyperlipidemia: Secondary | ICD-10-CM

## 2022-08-09 DIAGNOSIS — G932 Benign intracranial hypertension: Secondary | ICD-10-CM

## 2022-08-09 DIAGNOSIS — E669 Obesity, unspecified: Secondary | ICD-10-CM

## 2022-08-09 DIAGNOSIS — Z0001 Encounter for general adult medical examination with abnormal findings: Secondary | ICD-10-CM

## 2022-08-09 NOTE — Progress Notes (Unsigned)
Established Patient Office Visit  Subjective:  Patient ID: Paula Burgess, female    DOB: [birth year]  Age: 36 y.o. MRN: [id number]  CC:  Chief Complaint  Patient presents with   Annual Exam    Annual exam     HPI Paula Burgess is a 36 y.o. female with past medical history of IIH s/p shunt placement, CSF rhinorrhea and tobacco abuse who presents for annual physical.  Obesity: She has lost about 33 lbs since starting Wegovy.  She has been trying to follow low-carb diet and exercises regularly.  She had mild nausea when she took the first dose of Wegovy, but denies it now. She denies any bloating, abdominal pain or diarrhea.  She has had shunt placement for IIH. Her headaches and CSF rhinorrhea have improved now. She follows up with Neurosurgeon and ENT specialist.  She has stopped taking Topamax as her migraine has improved now.  Past Medical History:  Diagnosis Date   GERD (gastroesophageal reflux disease)    Intracranial hypertension     Past Surgical History:  Procedure Laterality Date   ceribrial stent     CESAREAN SECTION  2012   CESAREAN SECTION N/A 02/12/2014   Procedure: Repeat CESAREAN SECTION;  Surgeon: Lovenia Kim, MD;  Location: [facility];  Service: Obstetrics;  Laterality: N/A;   DILATION AND EVACUATION N/A 01/23/2021   Procedure: DILATATION AND EVACUATION;  Surgeon: Armandina Stammer, DO;  Location: [facility];  Service: Gynecology;  Laterality: N/A;    Family History  Problem Relation Age of Onset   Healthy Mother    Healthy Father    Breast cancer Maternal Grandmother    Diabetes Maternal Grandmother    Diabetes Maternal Grandfather    Diabetes Paternal Grandmother    Diabetes Paternal Grandfather    Migraines Neg Hx    Pseudotumor cerebri Neg Hx    Heart failure Neg Hx     Social History   Socioeconomic History   Marital status: Divorced    Spouse name: Not on file   Number of children: 2   Years of education: Not on file   Highest education  level: Associate degree: occupational, Hotel manager, or vocational program  Occupational History   Not on file  Tobacco Use   Smoking status: Every Day    Packs/day: 0.50    Years: 15.00    Total pack years: 7.50    Types: Cigarettes    Start date: 2006   Smokeless tobacco: Never  Vaping Use   Vaping Use: Never used  Substance and Sexual Activity   Alcohol use: Yes    Comment: occ   Drug use: No   Sexual activity: Not on file  Other Topics Concern   Not on file  Social History Narrative   ** Merged History Encounter **       Lives with her children Right handed Caffeine: maybe 3 cups/day   Social Determinants of Health   Financial Resource Strain: Not on file  Food Insecurity: Not on file  Transportation Needs: Not on file  Physical Activity: Not on file  Stress: Not on file  Social Connections: Not on file  Intimate Partner Violence: Not on file    Outpatient Medications Prior to Visit  Medication Sig Dispense Refill   Semaglutide-Weight Management 2.4 MG/0.75ML SOAJ Inject 2.4 mg into the skin once a week. 3 mL 5   rizatriptan (MAXALT-MLT) 10 MG disintegrating tablet Dissolve 1 tablet (10 mg total) by mouth as needed  for migraine. May repeat in 2 hours if needed (Patient not taking: Reported on 08/09/2022) 9 tablet 11   topiramate (TOPAMAX) 25 MG tablet Start with 1 tablet by mouth at bedtime. May increase to 2 tablets if needed. (Patient not taking: Reported on 08/09/2022) 180 tablet 3   No facility-administered medications prior to visit.    No Known Allergies  ROS Review of Systems  Constitutional:  Negative for chills and fever.  HENT:  Negative for congestion, sinus pressure, sinus pain and sore throat.   Eyes:  Negative for pain and discharge.  Respiratory:  Negative for cough and shortness of breath.   Cardiovascular:  Negative for chest pain and palpitations.  Gastrointestinal:  Negative for abdominal pain, diarrhea, nausea and vomiting.  Endocrine:  Negative for polydipsia and polyuria.  Genitourinary:  Negative for dysuria and hematuria.  Musculoskeletal:  Negative for neck pain and neck stiffness.  Skin:  Negative for rash.  Neurological:  Negative for weakness and headaches.  Psychiatric/Behavioral:  Negative for agitation and behavioral problems.       Objective:    Physical Exam Vitals reviewed.  Constitutional:      General: She is not in acute distress.    Appearance: She is not diaphoretic.  HENT:     Head: Normocephalic.     Nose: Nose normal.     Mouth/Throat:     Mouth: Mucous membranes are moist.  Eyes:     General: No scleral icterus.    Extraocular Movements: Extraocular movements intact.  Cardiovascular:     Rate and Rhythm: Normal rate and regular rhythm.     Pulses: Normal pulses.     Heart sounds: Normal heart sounds. No murmur heard. Pulmonary:     Breath sounds: Normal breath sounds. No wheezing or rales.  Abdominal:     Palpations: Abdomen is soft.     Tenderness: There is no abdominal tenderness.  Musculoskeletal:     Cervical back: Neck supple. No tenderness.     Right lower leg: No edema.     Left lower leg: No edema.  Skin:    General: Skin is warm.     Findings: No rash.  Neurological:     General: No focal deficit present.     Mental Status: She is alert and oriented to person, place, and time.     Cranial Nerves: No cranial nerve deficit.     Sensory: No sensory deficit.     Motor: No weakness.  Psychiatric:        Mood and Affect: Mood normal.        Behavior: Behavior normal.     BP 112/84 (BP Location: Right Arm, Patient Position: Sitting, Cuff Size: Normal)   Pulse 77   Resp 16   Ht _0  (1.651 m)   Wt 188 lb (85.3 kg)   SpO2 97%   BMI 31.28 kg/m  Wt Readings from Last 3 Encounters:  08/09/22 188 lb (85.3 kg)  04/08/22 199 lb 6.4 oz (90.4 kg)  01/07/22 214 lb (97.1 kg)    Lab Results  Component Value Date   TSH 0.974 07/12/2022   Lab Results  Component Value  Date   WBC 9.3 07/12/2022   HGB 13.8 07/12/2022   HCT 42.1 07/12/2022   MCV 92 07/12/2022   PLT 306 07/12/2022   Lab Results  Component Value Date   NA 136 07/12/2022   K 4.3 07/12/2022   CO2 20 07/12/2022   GLUCOSE 88 07/12/2022  BUN 10 07/12/2022   CREATININE 0.75 07/12/2022   BILITOT 0.3 07/12/2022   ALKPHOS 52 07/12/2022   AST 13 07/12/2022   ALT 10 07/12/2022   PROT 6.4 07/12/2022   ALBUMIN 4.5 07/12/2022   CALCIUM 9.7 07/12/2022   EGFR 105 07/12/2022   Lab Results  Component Value Date   CHOL 173 07/12/2022   Lab Results  Component Value Date   HDL 45 07/12/2022   Lab Results  Component Value Date   LDLCALC 115 (H) 07/12/2022   Lab Results  Component Value Date   TRIG 65 07/12/2022   Lab Results  Component Value Date   CHOLHDL 3.8 07/12/2022   Lab Results  Component Value Date   HGBA1C 5.2 07/12/2022      Assessment & Plan:   Problem List Items Addressed This Visit       Nervous and Auditory   IIH (idiopathic intracranial hypertension)    With CSF rhinorrhea Followed by ENT and Neurosurgeon S/p shunt placement Headaches better now, still has rhinorrhea, but manageable according to the patient - followed by Neurology        Other   Obesity (BMI 30-39.9)    BMI Readings from Last 3 Encounters:  08/09/22 31.28 kg/m  04/08/22 33.18 kg/m  01/07/22 35.61 kg/m  Initial BMI - 36.78 - on Wegovy for weight loss Diet modification and moderate exercise, continue to follow low carb diet Has lost 11 lbs since the last visit      Encounter for general adult medical examination with abnormal findings - Primary    Physical exam as documented. Fasting blood tests reviewed. Has had Tdap vaccine - date unknown.      Mixed hyperlipidemia    Advised to follow low carb diet for now       No orders of the defined types were placed in this encounter.   Follow-up: Return in about 4 months (around 12/08/2022) for Weight management.    Lindell Spar, MD

## 2022-08-09 NOTE — Assessment & Plan Note (Addendum)
BMI Readings from Last 3 Encounters:  08/09/22 31.28 kg/m  04/08/22 33.18 kg/m  01/07/22 35.61 kg/m   Initial BMI - 36.78 - on Wegovy for weight loss Diet modification and moderate exercise, continue to follow low carb diet Has lost 11 lbs since the last visit

## 2022-08-09 NOTE — Patient Instructions (Signed)
Please continue to take medications as prescribed. ? ?Please continue to follow low carb diet and perform moderate exercise/walking at least 150 mins/week. ?

## 2022-08-11 DIAGNOSIS — E782 Mixed hyperlipidemia: Secondary | ICD-10-CM | POA: Insufficient documentation

## 2022-08-11 NOTE — Assessment & Plan Note (Signed)
Advised to follow low carb diet for now 

## 2022-08-11 NOTE — Assessment & Plan Note (Signed)
With CSF rhinorrhea Followed by ENT and Neurosurgeon S/p shunt placement Headaches better now, still has rhinorrhea, but manageable according to the patient - followed by Neurology

## 2022-08-11 NOTE — Assessment & Plan Note (Addendum)
Physical exam as documented. Fasting blood tests reviewed. Has had Tdap vaccine - date unknown.

## 2022-08-31 ENCOUNTER — Other Ambulatory Visit (HOSPITAL_COMMUNITY): Payer: Self-pay

## 2022-08-31 MED ORDER — CHLORHEXIDINE GLUCONATE 0.12 % MT SOLN
OROMUCOSAL | 2 refills | Status: AC
  Filled 2022-08-31: qty 473, 17d supply, fill #0

## 2022-10-07 ENCOUNTER — Other Ambulatory Visit (HOSPITAL_COMMUNITY): Payer: Self-pay

## 2022-10-07 ENCOUNTER — Other Ambulatory Visit: Payer: Self-pay | Admitting: Internal Medicine

## 2022-10-07 DIAGNOSIS — E669 Obesity, unspecified: Secondary | ICD-10-CM

## 2022-10-07 MED ORDER — WEGOVY 2.4 MG/0.75ML ~~LOC~~ SOAJ
2.4000 mg | SUBCUTANEOUS | 5 refills | Status: AC
  Filled 2022-10-07: qty 3, 28d supply, fill #0
  Filled 2022-10-23: qty 3, 28d supply, fill #1
  Filled 2022-11-24: qty 3, 28d supply, fill #2
  Filled 2022-12-14 – 2022-12-26 (×2): qty 3, 28d supply, fill #3
  Filled 2023-01-26: qty 3, 28d supply, fill #4
  Filled 2023-02-18: qty 3, 28d supply, fill #5

## 2022-10-08 ENCOUNTER — Other Ambulatory Visit (HOSPITAL_COMMUNITY): Payer: Self-pay

## 2022-10-24 ENCOUNTER — Other Ambulatory Visit (HOSPITAL_COMMUNITY): Payer: Self-pay

## 2022-12-08 ENCOUNTER — Encounter: Payer: Self-pay | Admitting: Internal Medicine

## 2022-12-08 ENCOUNTER — Ambulatory Visit (INDEPENDENT_AMBULATORY_CARE_PROVIDER_SITE_OTHER): Payer: 59 | Admitting: Internal Medicine

## 2022-12-08 ENCOUNTER — Other Ambulatory Visit (HOSPITAL_COMMUNITY): Payer: Self-pay

## 2022-12-08 VITALS — BP 123/84 | HR 85 | Ht 65.0 in | Wt 178.2 lb

## 2022-12-08 DIAGNOSIS — K5904 Chronic idiopathic constipation: Secondary | ICD-10-CM

## 2022-12-08 DIAGNOSIS — G932 Benign intracranial hypertension: Secondary | ICD-10-CM | POA: Diagnosis not present

## 2022-12-08 DIAGNOSIS — E669 Obesity, unspecified: Secondary | ICD-10-CM

## 2022-12-08 DIAGNOSIS — D229 Melanocytic nevi, unspecified: Secondary | ICD-10-CM

## 2022-12-08 DIAGNOSIS — E782 Mixed hyperlipidemia: Secondary | ICD-10-CM

## 2022-12-08 DIAGNOSIS — Z720 Tobacco use: Secondary | ICD-10-CM

## 2022-12-08 MED ORDER — LINACLOTIDE 72 MCG PO CAPS
72.0000 ug | ORAL_CAPSULE | Freq: Every day | ORAL | 3 refills | Status: AC
  Filled 2022-12-08: qty 30, 30d supply, fill #0
  Filled 2023-10-26: qty 30, 30d supply, fill #1

## 2022-12-08 NOTE — Assessment & Plan Note (Signed)
Smokes about 0.5 pack/day  Asked about quitting: confirms that she currently smokes cigarettes Advise to quit smoking: Educated about QUITTING to reduce the risk of cancer, cardio and cerebrovascular disease. Assess willingness: Unwilling to quit at this time, but is working on cutting back. Assist with counseling and pharmacotherapy: Counseled for 5 minutes and literature provided. Arrange for follow up: Follow up in 3 months and continue to offer help.

## 2022-12-08 NOTE — Assessment & Plan Note (Signed)
Advised to follow low carb diet for now

## 2022-12-08 NOTE — Patient Instructions (Addendum)
Please start taking Linzess as prescribed for constipation.  Please maintain adequate hydration by taking 64 ounces of fluid in a day.  Please continue taking other medications as prescribed.  Please continue to follow low carb diet and perform moderate exercise/walking at least 150 mins/week.  Please get fasting blood tests done before the next visit.

## 2022-12-08 NOTE — Assessment & Plan Note (Signed)
Chronic skin lesion, recently increased in size Referred to Dermatology for further evaluation

## 2022-12-08 NOTE — Assessment & Plan Note (Signed)
Persistent constipation despite using Colace/Dulcolax, MiraLAX and Benefiber Started Linzess Advised to maintain adequate hydration Can try prune juice

## 2022-12-08 NOTE — Assessment & Plan Note (Addendum)
With CSF rhinorrhea Followed by ENT and Neurosurgeon S/p shunt placement Headaches better now, still has rhinorrhea, but manageable according to the patient - followed by Neurology DC Topiramate as she has not been taking it

## 2022-12-08 NOTE — Progress Notes (Signed)
Established Patient Office Visit  Subjective:  Patient ID: Paula Burgess, female    DOB: 08-Jan-1986  Age: 37 y.o. MRN: [id number]  CC:  Chief Complaint  Patient presents with   Weight Management Screening    Four month follow up    HPI Paula Burgess is a 37 y.o. female with past medical history of IIH s/p shunt placement, CSF rhinorrhea and tobacco abuse who presents for f/u of her chronic medical conditions.  Obesity: She has lost about 43 lbs since starting Wegovy.  She has been trying to follow low-carb diet and exercises regularly.  She had mild nausea when she took the first dose of Wegovy, but denies it now. She denies any bloating, abdominal pain or diarrhea.  She has chronic constipation.  She has tried Colace, Dulcolax, MiraLAX and Benefiber without much relief.  She has about 1-2 bowel movements in a week.  Denies any melena or hematochezia.  She also has mild bloating.  She also reports having the chronic mole on right flank area, which has increased in size recently.  Denies any local redness or itching.  Past Medical History:  Diagnosis Date   GERD (gastroesophageal reflux disease)    Intracranial hypertension     Past Surgical History:  Procedure Laterality Date   ceribrial stent     CESAREAN SECTION  2012   CESAREAN SECTION N/A 02/12/2014   Procedure: Repeat CESAREAN SECTION;  Surgeon: Lovenia Kim, MD;  Location: [facility];  Service: Obstetrics;  Laterality: N/A;   DILATION AND EVACUATION N/A 01/23/2021   Procedure: DILATATION AND EVACUATION;  Surgeon: Armandina Stammer, DO;  Location: [place];  Service: Gynecology;  Laterality: N/A;    Family History  Problem Relation Age of Onset   Healthy Mother    Healthy Father    Breast cancer Maternal Grandmother    Diabetes Maternal Grandmother    Diabetes Maternal Grandfather    Diabetes Paternal Grandmother    Diabetes Paternal Grandfather    Migraines Neg Hx    Pseudotumor cerebri Neg Hx    Heart failure  Neg Hx     Social History   Socioeconomic History   Marital status: Divorced    Spouse name: Not on file   Number of children: 2   Years of education: Not on file   Highest education level: Associate degree: occupational, Hotel manager, or vocational program  Occupational History   Not on file  Tobacco Use   Smoking status: Every Day    Packs/day: 0.50    Years: 15.00    Total pack years: 7.50    Types: Cigarettes    Start date: 2006   Smokeless tobacco: Never  Vaping Use   Vaping Use: Never used  Substance and Sexual Activity   Alcohol use: Yes    Comment: occ   Drug use: No   Sexual activity: Not on file  Other Topics Concern   Not on file  Social History Narrative   ** Merged History Encounter **       Lives with her children Right handed Caffeine: maybe 3 cups/day   Social Determinants of Health   Financial Resource Strain: Not on file  Food Insecurity: Not on file  Transportation Needs: Not on file  Physical Activity: Not on file  Stress: Not on file  Social Connections: Not on file  Intimate Partner Violence: Not on file    Outpatient Medications Prior to Visit  Medication Sig Dispense Refill   chlorhexidine (  PERIDEX) 0.12 % solution RINSE MOUTH WITH 15ML (1 CAPFUL) FOR 30 SECONDS MORNING AND EVENING AFTER TOOTHBRUSHING. EXPECTORATE AFTER RINSING, DO NOT SWALLOW 473 mL 2   rizatriptan (MAXALT-MLT) 10 MG disintegrating tablet Dissolve 1 tablet (10 mg total) by mouth as needed for migraine. May repeat in 2 hours if needed (Patient not taking: Reported on 08/09/2022) 9 tablet 11   Semaglutide-Weight Management (WEGOVY) 2.4 MG/0.75ML SOAJ Inject 2.4 mg into the skin once a week. 3 mL 5   topiramate (TOPAMAX) 25 MG tablet Start with 1 tablet by mouth at bedtime. May increase to 2 tablets if needed. (Patient not taking: Reported on 08/09/2022) 180 tablet 3   No facility-administered medications prior to visit.    No Known Allergies  ROS Review of Systems   Constitutional:  Negative for chills and fever.  HENT:  Negative for congestion, sinus pressure, sinus pain and sore throat.   Eyes:  Negative for pain and discharge.  Respiratory:  Negative for cough and shortness of breath.   Cardiovascular:  Negative for chest pain and palpitations.  Gastrointestinal:  Positive for constipation. Negative for abdominal pain, diarrhea, nausea and vomiting.  Endocrine: Negative for polydipsia and polyuria.  Genitourinary:  Negative for dysuria and hematuria.  Musculoskeletal:  Negative for neck pain and neck stiffness.  Skin:  Negative for rash.  Neurological:  Negative for weakness and headaches.  Psychiatric/Behavioral:  Negative for agitation and behavioral problems.       Objective:    Physical Exam Vitals reviewed.  Constitutional:      General: She is not in acute distress.    Appearance: She is not diaphoretic.  HENT:     Head: Normocephalic.     Nose: Nose normal.     Mouth/Throat:     Mouth: Mucous membranes are moist.  Eyes:     General: No scleral icterus.    Extraocular Movements: Extraocular movements intact.  Cardiovascular:     Rate and Rhythm: Normal rate and regular rhythm.     Pulses: Normal pulses.     Heart sounds: Normal heart sounds. No murmur heard. Pulmonary:     Breath sounds: Normal breath sounds. No wheezing or rales.  Abdominal:     Palpations: Abdomen is soft.     Tenderness: There is no abdominal tenderness.  Musculoskeletal:     Cervical back: Neck supple. No tenderness.     Right lower leg: No edema.     Left lower leg: No edema.  Skin:    General: Skin is warm.     Findings: Lesion (Brownish, linear mole over right flank area - about 3 cm in length, with dark to light brown color variation) present. No rash.  Neurological:     General: No focal deficit present.     Mental Status: She is alert and oriented to person, place, and time.     Sensory: No sensory deficit.     Motor: No weakness.   Psychiatric:        Mood and Affect: Mood normal.        Behavior: Behavior normal.     BP 123/84 (BP Location: Right Arm, Patient Position: Sitting, Cuff Size: Normal)   Pulse 85   Ht '5\' 5"'$  (1.651 m)   Wt 178 lb 3.2 oz (80.8 kg)   SpO2 99%   BMI 29.65 kg/m  Wt Readings from Last 3 Encounters:  12/08/22 178 lb 3.2 oz (80.8 kg)  08/09/22 188 lb (85.3 kg)  04/08/22 199  lb 6.4 oz (90.4 kg)    Lab Results  Component Value Date   TSH 0.974 07/12/2022   Lab Results  Component Value Date   WBC 9.3 07/12/2022   HGB 13.8 07/12/2022   HCT 42.1 07/12/2022   MCV 92 07/12/2022   PLT 306 07/12/2022   Lab Results  Component Value Date   NA 136 07/12/2022   K 4.3 07/12/2022   CO2 20 07/12/2022   GLUCOSE 88 07/12/2022   BUN 10 07/12/2022   CREATININE 0.75 07/12/2022   BILITOT 0.3 07/12/2022   ALKPHOS 52 07/12/2022   AST 13 07/12/2022   ALT 10 07/12/2022   PROT 6.4 07/12/2022   ALBUMIN 4.5 07/12/2022   CALCIUM 9.7 07/12/2022   EGFR 105 07/12/2022   Lab Results  Component Value Date   CHOL 173 07/12/2022   Lab Results  Component Value Date   HDL 45 07/12/2022   Lab Results  Component Value Date   LDLCALC 115 (H) 07/12/2022   Lab Results  Component Value Date   TRIG 65 07/12/2022   Lab Results  Component Value Date   CHOLHDL 3.8 07/12/2022   Lab Results  Component Value Date   HGBA1C 5.2 07/12/2022      Assessment & Plan:   Problem List Items Addressed This Visit       Digestive   Chronic idiopathic constipation    Persistent constipation despite using Colace/Dulcolax, MiraLAX and Benefiber Started Linzess Advised to maintain adequate hydration Can try prune juice      Relevant Medications   linaclotide (LINZESS) 72 MCG capsule     Nervous and Auditory   IIH (idiopathic intracranial hypertension)    With CSF rhinorrhea Followed by ENT and Neurosurgeon S/p shunt placement Headaches better now, still has rhinorrhea, but manageable according  to the patient - followed by Neurology DC Topiramate as she has not been taking it        Musculoskeletal and Integument   Enlarged skin mole    Chronic skin lesion, recently increased in size Referred to Dermatology for further evaluation      Relevant Orders   Ambulatory referral to Dermatology     Other   Tobacco abuse    Smokes about 0.5 pack/day  Asked about quitting: confirms that she currently smokes cigarettes Advise to quit smoking: Educated about QUITTING to reduce the risk of cancer, cardio and cerebrovascular disease. Assess willingness: Unwilling to quit at this time, but is working on cutting back. Assist with counseling and pharmacotherapy: Counseled for 5 minutes and literature provided. Arrange for follow up: Follow up in 3 months and continue to offer help.      Obesity (BMI 30-39.9) - Primary    BMI Readings from Last 3 Encounters:  12/08/22 29.65 kg/m  08/09/22 31.28 kg/m  04/08/22 33.18 kg/m  Initial BMI - 36.78 - on Wegovy for weight loss Diet modification and moderate exercise, continue to follow low carb diet Has lost 10 lbs since the last visit      Relevant Orders   CMP14+EGFR   Mixed hyperlipidemia    Advised to follow low carb diet for now      Relevant Orders   CMP14+EGFR   Lipid Profile    Meds ordered this encounter  Medications   linaclotide (LINZESS) 72 MCG capsule    Sig: Take 1 capsule (72 mcg total) by mouth daily before breakfast.    Dispense:  30 capsule    Refill:  3  Follow-up: Return in about 4 months (around 04/08/2023) for Weight management.    Lindell Spar, MD

## 2022-12-08 NOTE — Assessment & Plan Note (Signed)
BMI Readings from Last 3 Encounters:  12/08/22 29.65 kg/m  08/09/22 31.28 kg/m  04/08/22 33.18 kg/m   Initial BMI - 36.78 - on Wegovy for weight loss Diet modification and moderate exercise, continue to follow low carb diet Has lost 10 lbs since the last visit

## 2022-12-14 ENCOUNTER — Other Ambulatory Visit (HOSPITAL_COMMUNITY): Payer: Self-pay

## 2022-12-21 DIAGNOSIS — H5213 Myopia, bilateral: Secondary | ICD-10-CM | POA: Diagnosis not present

## 2023-02-20 ENCOUNTER — Other Ambulatory Visit (HOSPITAL_COMMUNITY): Payer: Self-pay

## 2023-03-15 ENCOUNTER — Other Ambulatory Visit (HOSPITAL_COMMUNITY): Payer: Self-pay

## 2023-03-15 MED ORDER — IBUPROFEN 800 MG PO TABS
ORAL_TABLET | ORAL | 0 refills | Status: AC
  Filled 2023-03-15: qty 12, 3d supply, fill #0

## 2023-04-11 ENCOUNTER — Ambulatory Visit: Payer: 59 | Admitting: Internal Medicine

## 2023-04-20 ENCOUNTER — Ambulatory Visit: Payer: 59 | Admitting: Internal Medicine

## 2023-04-20 ENCOUNTER — Other Ambulatory Visit (HOSPITAL_COMMUNITY): Payer: Self-pay

## 2023-04-20 ENCOUNTER — Encounter: Payer: Self-pay | Admitting: Internal Medicine

## 2023-04-20 VITALS — BP 116/83 | HR 88 | Ht 64.0 in | Wt 180.6 lb

## 2023-04-20 DIAGNOSIS — E559 Vitamin D deficiency, unspecified: Secondary | ICD-10-CM | POA: Diagnosis not present

## 2023-04-20 DIAGNOSIS — K5904 Chronic idiopathic constipation: Secondary | ICD-10-CM

## 2023-04-20 DIAGNOSIS — R739 Hyperglycemia, unspecified: Secondary | ICD-10-CM | POA: Diagnosis not present

## 2023-04-20 DIAGNOSIS — G932 Benign intracranial hypertension: Secondary | ICD-10-CM | POA: Diagnosis not present

## 2023-04-20 DIAGNOSIS — E669 Obesity, unspecified: Secondary | ICD-10-CM

## 2023-04-20 DIAGNOSIS — E782 Mixed hyperlipidemia: Secondary | ICD-10-CM | POA: Diagnosis not present

## 2023-04-20 MED ORDER — QSYMIA 3.75-23 MG PO CP24
1.0000 | ORAL_CAPSULE | Freq: Every day | ORAL | 0 refills | Status: DC
  Filled 2023-04-20: qty 30, 30d supply, fill #0

## 2023-04-20 NOTE — Assessment & Plan Note (Signed)
Advised to follow low carb diet for now 

## 2023-04-20 NOTE — Patient Instructions (Signed)
Please start taking Qsymia as prescribed.  Please continue to take medications as prescribed.  Please continue to follow low carb diet and perform moderate exercise/walking at least 150 mins/week.  Please get fasting blood tests done before the next visit.

## 2023-04-20 NOTE — Assessment & Plan Note (Addendum)
With CSF rhinorrhea Followed by ENT and Neurosurgeon S/p shunt placement Headaches better now, still has rhinorrhea, but manageable according to the patient - followed by Neurology 

## 2023-04-20 NOTE — Assessment & Plan Note (Addendum)
Persistent constipation despite using Colace/Dulcolax, MiraLAX and Benefiber On Linzess now, improved, takes PRN Advised to maintain adequate hydration Can try prune juice

## 2023-04-20 NOTE — Assessment & Plan Note (Addendum)
BMI Readings from Last 3 Encounters:  04/20/23 31.00 kg/m  12/08/22 29.65 kg/m  08/09/22 31.28 kg/m   Initial BMI - 36.78 - on Wegovy for weight loss Diet modification and moderate exercise, continue to follow low carb diet Has lost 41 lbs with ZOXWRU, currently weight stable with Wegovy every other week since 04/24, but has only 2 doses left now  She would benefit from continued medical help with weight loss, started Qsymia - plan to increase dose as tolerated

## 2023-04-20 NOTE — Progress Notes (Signed)
Established Patient Office Visit  Subjective:  Patient ID: Paula Burgess, female    DOB: 09-04-1986  Age: 37 y.o. MRN: [id number]  CC:  Chief Complaint  Patient presents with   Weight Loss    Follow up    HPI Paula Burgess is a 37 y.o. female with past medical history of IIH s/p shunt placement, CSF rhinorrhea and tobacco abuse who presents for f/u of her chronic medical conditions.  Obesity: She had lost about 41 lbs since starting [facility] and has maintained overall since taking Wegovy every other week since 04/24 as she does not have coverage for [facility] now, but has only 2 doses left now.  She has been trying to follow low-carb diet and exercises regularly. She denies any bloating, abdominal pain or diarrhea.  She has chronic constipation.  She has tried Colace, Dulcolax, MiraLAX and Benefiber without much relief.  She has about 1-2 bowel movements in a week.  Denies any melena or hematochezia.  She also has mild bloating.  She also reports having the chronic mole on right flank area, which has increased in size recently.  Denies any local redness or itching.  Past Medical History:  Diagnosis Date   GERD (gastroesophageal reflux disease)    Intracranial hypertension     Past Surgical History:  Procedure Laterality Date   ceribrial stent     CESAREAN SECTION  2012   CESAREAN SECTION N/A 02/12/2014   Procedure: Repeat CESAREAN SECTION;  Surgeon: Paula Burgess;  Location: [facility];  Service: Obstetrics;  Laterality: N/A;   DILATION AND EVACUATION N/A 01/23/2021   Procedure: DILATATION AND EVACUATION;  Surgeon: Toy Baker, DO;  Location: [facility];  Service: Gynecology;  Laterality: N/A;    Family History  Problem Relation Age of Onset   Healthy Mother    Healthy Father    Breast cancer Maternal Grandmother    Diabetes Maternal Grandmother    Diabetes Maternal Grandfather    Diabetes Paternal Grandmother    Diabetes Paternal Grandfather    Migraines Neg Hx     Pseudotumor cerebri Neg Hx    Heart failure Neg Hx     Social History   Socioeconomic History   Marital status: Divorced    Spouse name: Not on file   Number of children: 2   Years of education: Not on file   Highest education level: Associate degree: occupational, Scientist, product/process development, or vocational program  Occupational History   Not on file  Tobacco Use   Smoking status: Every Day    Current packs/day: 0.50    Average packs/day: 0.5 packs/day for 18.5 years (9.3 ttl pk-yrs)    Types: Cigarettes    Start date: 2006   Smokeless tobacco: Never  Vaping Use   Vaping status: Never Used  Substance and Sexual Activity   Alcohol use: Yes    Comment: occ   Drug use: No   Sexual activity: Not on file  Other Topics Concern   Not on file  Social History Narrative   ** Merged History Encounter **       Lives with her children Right handed Caffeine: maybe 3 cups/day   Social Determinants of Health   Financial Resource Strain: Low Risk  (04/19/2023)   Overall Financial Resource Strain (CARDIA)    Difficulty of Paying Living Expenses: Not hard at all  Food Insecurity: No Food Insecurity (04/19/2023)   Hunger Vital Sign    Worried About Programme researcher, broadcasting/film/video in  the Last Year: Never true    Ran Out of Food in the Last Year: Never true  Transportation Needs: No Transportation Needs (04/19/2023)   PRAPARE - Administrator, Civil Service (Medical): No    Lack of Transportation (Non-Medical): No  Physical Activity: Insufficiently Active (04/19/2023)   Exercise Vital Sign    Days of Exercise per Week: 2 days    Minutes of Exercise per Session: 30 min  Stress: No Stress Concern Present (04/19/2023)   [employer] of Occupational Health - Occupational Stress Questionnaire    Feeling of Stress : Not at all  Social Connections: Moderately Integrated (04/19/2023)   Social Connection and Isolation Panel [NHANES]    Frequency of Communication with Friends and Family: More than three times  a week    Frequency of Social Gatherings with Friends and Family: Once a week    Attends Religious Services: 1 to 4 times per year    Active Member of [employer] or Organizations: Yes    Attends Engineer, structural: More than 4 times per year    Marital Status: Divorced  Catering manager Violence: Not on file    Outpatient Medications Prior to Visit  Medication Sig Dispense Refill   chlorhexidine (PERIDEX) 0.12 % solution RINSE MOUTH WITH (1 CAPFUL) FOR 30 SECONDS MORNING AND EVENING AFTER TOOTHBRUSHING. EXPECTORATE AFTER RINSING, DO NOT SWALLOW 473 mL 2   ibuprofen (ADVIL) 800 MG tablet TAKE 1 TABLET BY MOUTH EVERY 6 HOURS WITH FOOD. 12 tablet 0   linaclotide (LINZESS) 72 MCG capsule Take 1 capsule (72 mcg total) by mouth daily before breakfast. 30 capsule 3   rizatriptan (MAXALT-MLT) 10 MG disintegrating tablet Dissolve 1 tablet (10 mg total) by mouth as needed for migraine. May repeat in 2 hours if needed 9 tablet 11   No facility-administered medications prior to visit.    No Known Allergies  ROS Review of Systems  Constitutional:  Negative for chills and fever.  HENT:  Negative for congestion, sinus pressure, sinus pain and sore throat.   Eyes:  Negative for pain and discharge.  Respiratory:  Negative for cough and shortness of breath.   Cardiovascular:  Negative for chest pain and palpitations.  Gastrointestinal:  Positive for constipation. Negative for abdominal pain, diarrhea, nausea and vomiting.  Endocrine: Negative for polydipsia and polyuria.  Genitourinary:  Negative for dysuria and hematuria.  Musculoskeletal:  Negative for neck pain and neck stiffness.  Skin:  Negative for rash.  Neurological:  Negative for weakness and headaches.  Psychiatric/Behavioral:  Negative for agitation and behavioral problems.       Objective:    Physical Exam Vitals reviewed.  Constitutional:      General: She is not in acute distress.    Appearance: She is not  diaphoretic.  HENT:     Head: Normocephalic.     Nose: Nose normal.     Mouth/Throat:     Mouth: Mucous membranes are moist.  Eyes:     General: No scleral icterus.    Extraocular Movements: Extraocular movements intact.  Cardiovascular:     Rate and Rhythm: Normal rate and regular rhythm.     Pulses: Normal pulses.     Heart sounds: Normal heart sounds. No murmur heard. Pulmonary:     Breath sounds: Normal breath sounds. No wheezing or rales.  Musculoskeletal:     Cervical back: Neck supple. No tenderness.     Right lower leg: No edema.  Left lower leg: No edema.  Skin:    General: Skin is warm.     Findings: Lesion (Brownish, linear mole over right flank area - about 3 cm in length, with dark to light brown color variation) present. No rash.  Neurological:     General: No focal deficit present.     Mental Status: She is alert and oriented to person, place, and time.     Sensory: No sensory deficit.     Motor: No weakness.  Psychiatric:        Mood and Affect: Mood normal.        Behavior: Behavior normal.     BP 116/83   Pulse 88   Ht 5\' 4"  (1.626 m)   Wt 180 lb 9.6 oz (81.9 kg)   SpO2 97%   BMI 31.00 kg/m  Wt Readings from Last 3 Encounters:  04/20/23 180 lb 9.6 oz (81.9 kg)  12/08/22 178 lb 3.2 oz (80.8 kg)  08/09/22 188 lb (85.3 kg)    Lab Results  Component Value Date   TSH 0.974 07/12/2022   Lab Results  Component Value Date   WBC 9.3 07/12/2022   HGB 13.8 07/12/2022   HCT 42.1 07/12/2022   MCV 92 07/12/2022   PLT 306 07/12/2022   Lab Results  Component Value Date   NA 136 07/12/2022   K 4.3 07/12/2022   CO2 20 07/12/2022   GLUCOSE 88 07/12/2022   BUN 10 07/12/2022   CREATININE 0.75 07/12/2022   BILITOT 0.3 07/12/2022   ALKPHOS 52 07/12/2022   AST 13 07/12/2022   ALT 10 07/12/2022   PROT 6.4 07/12/2022   ALBUMIN 4.5 07/12/2022   CALCIUM 9.7 07/12/2022   EGFR 105 07/12/2022   Lab Results  Component Value Date   CHOL 173 07/12/2022    Lab Results  Component Value Date   HDL 45 07/12/2022   Lab Results  Component Value Date   LDLCALC 115 (H) 07/12/2022   Lab Results  Component Value Date   TRIG 65 07/12/2022   Lab Results  Component Value Date   CHOLHDL 3.8 07/12/2022   Lab Results  Component Value Date   HGBA1C 5.2 07/12/2022      Assessment & Plan:   Problem List Items Addressed This Visit       Digestive   Chronic idiopathic constipation    Persistent constipation despite using Colace/Dulcolax, MiraLAX and Benefiber On Linzess now, improved, takes PRN Advised to maintain adequate hydration Can try prune juice        Nervous and Auditory   IIH (idiopathic intracranial hypertension)    With CSF rhinorrhea Followed by ENT and Neurosurgeon S/p shunt placement Headaches better now, still has rhinorrhea, but manageable according to the patient - followed by Neurology      Relevant Orders   TSH   CBC with Differential/Platelet     Other   Obesity (BMI 30-39.9) - Primary    BMI Readings from Last 3 Encounters:  04/20/23 31.00 kg/m  12/08/22 29.65 kg/m  08/09/22 31.28 kg/m   Initial BMI - 36.78 - on Wegovy for weight loss Diet modification and moderate exercise, continue to follow low carb diet Has lost 41 lbs with BJYNWG, currently weight stable with Wegovy every other week since 04/24, but has only 2 doses left now  She would benefit from continued medical help with weight loss, started Qsymia - plan to increase dose as tolerated      Relevant Medications  Phentermine-Topiramate (QSYMIA) 3.75-23 MG CP24   Other Relevant Orders   CMP14+EGFR   Mixed hyperlipidemia    Advised to follow low carb diet for now      Relevant Orders   Lipid panel   Other Visit Diagnoses     Vitamin D deficiency       Relevant Orders   VITAMIN D 25 Hydroxy (Vit-D Deficiency, Fractures)   Hyperglycemia       Relevant Orders   Hemoglobin A1c   CMP14+EGFR        Meds ordered this  encounter  Medications   Phentermine-Topiramate (QSYMIA) 3.75-23 MG CP24    Sig: Take 1 capsule by mouth daily.    Dispense:  30 capsule    Refill:  0    Follow-up: Return in about 4 months (around 08/21/2023) for Annual physical.    Anabel Halon, Burgess

## 2023-05-30 ENCOUNTER — Other Ambulatory Visit (HOSPITAL_COMMUNITY): Payer: Self-pay

## 2023-05-31 ENCOUNTER — Telehealth: Payer: Self-pay | Admitting: Internal Medicine

## 2023-05-31 ENCOUNTER — Other Ambulatory Visit (HOSPITAL_COMMUNITY): Payer: Self-pay

## 2023-05-31 NOTE — Telephone Encounter (Signed)
Paula Burgess long has not started the PA, will submit today for [place] to complete

## 2023-05-31 NOTE — Telephone Encounter (Signed)
Pt called in to check status of pre auth on   Phentermine-Topiramate (QSYMIA) 3.75-23 MG CP24

## 2023-06-06 ENCOUNTER — Other Ambulatory Visit (HOSPITAL_COMMUNITY): Payer: Self-pay

## 2023-06-07 ENCOUNTER — Other Ambulatory Visit (HOSPITAL_COMMUNITY): Payer: Self-pay

## 2023-06-13 ENCOUNTER — Other Ambulatory Visit (HOSPITAL_COMMUNITY): Payer: Self-pay

## 2023-07-20 ENCOUNTER — Other Ambulatory Visit (HOSPITAL_COMMUNITY): Payer: Self-pay

## 2023-07-20 ENCOUNTER — Other Ambulatory Visit: Payer: Self-pay | Admitting: Internal Medicine

## 2023-07-20 DIAGNOSIS — E669 Obesity, unspecified: Secondary | ICD-10-CM

## 2023-07-20 MED ORDER — QSYMIA 11.25-69 MG PO CP24
1.0000 | ORAL_CAPSULE | Freq: Every day | ORAL | 0 refills | Status: DC
  Filled 2023-07-20: qty 30, 30d supply, fill #0

## 2023-07-20 MED ORDER — QSYMIA 7.5-46 MG PO CP24
1.0000 | ORAL_CAPSULE | Freq: Every day | ORAL | 0 refills | Status: DC
  Filled 2023-07-20: qty 30, 30d supply, fill #0

## 2023-07-21 ENCOUNTER — Other Ambulatory Visit (HOSPITAL_COMMUNITY): Payer: Self-pay

## 2023-08-07 DIAGNOSIS — I781 Nevus, non-neoplastic: Secondary | ICD-10-CM | POA: Diagnosis not present

## 2023-08-07 DIAGNOSIS — B078 Other viral warts: Secondary | ICD-10-CM | POA: Diagnosis not present

## 2023-08-07 DIAGNOSIS — D225 Melanocytic nevi of trunk: Secondary | ICD-10-CM | POA: Diagnosis not present

## 2023-08-07 DIAGNOSIS — D485 Neoplasm of uncertain behavior of skin: Secondary | ICD-10-CM | POA: Diagnosis not present

## 2023-08-22 ENCOUNTER — Encounter: Payer: Self-pay | Admitting: Internal Medicine

## 2023-08-22 ENCOUNTER — Ambulatory Visit (INDEPENDENT_AMBULATORY_CARE_PROVIDER_SITE_OTHER): Payer: 59 | Admitting: Internal Medicine

## 2023-08-22 ENCOUNTER — Other Ambulatory Visit (HOSPITAL_COMMUNITY): Payer: Self-pay

## 2023-08-22 VITALS — BP 129/88 | HR 97 | Ht 65.0 in | Wt 195.6 lb

## 2023-08-22 DIAGNOSIS — Z720 Tobacco use: Secondary | ICD-10-CM | POA: Diagnosis not present

## 2023-08-22 DIAGNOSIS — J019 Acute sinusitis, unspecified: Secondary | ICD-10-CM | POA: Diagnosis not present

## 2023-08-22 DIAGNOSIS — R739 Hyperglycemia, unspecified: Secondary | ICD-10-CM | POA: Diagnosis not present

## 2023-08-22 DIAGNOSIS — Z0001 Encounter for general adult medical examination with abnormal findings: Secondary | ICD-10-CM | POA: Diagnosis not present

## 2023-08-22 DIAGNOSIS — E559 Vitamin D deficiency, unspecified: Secondary | ICD-10-CM | POA: Diagnosis not present

## 2023-08-22 DIAGNOSIS — E782 Mixed hyperlipidemia: Secondary | ICD-10-CM

## 2023-08-22 DIAGNOSIS — E669 Obesity, unspecified: Secondary | ICD-10-CM

## 2023-08-22 DIAGNOSIS — K5904 Chronic idiopathic constipation: Secondary | ICD-10-CM

## 2023-08-22 DIAGNOSIS — G932 Benign intracranial hypertension: Secondary | ICD-10-CM

## 2023-08-22 MED ORDER — AZITHROMYCIN 250 MG PO TABS
ORAL_TABLET | ORAL | 0 refills | Status: AC
Start: 2023-08-22 — End: 2023-08-28
  Filled 2023-08-22: qty 6, 5d supply, fill #0

## 2023-08-22 MED ORDER — PHENTERMINE HCL 37.5 MG PO TABS
37.5000 mg | ORAL_TABLET | Freq: Every day | ORAL | 3 refills | Status: DC
  Filled 2023-08-22: qty 30, 30d supply, fill #0
  Filled 2023-10-26: qty 30, 30d supply, fill #1
  Filled 2023-12-03: qty 30, 30d supply, fill #2

## 2023-08-22 NOTE — Assessment & Plan Note (Signed)
With CSF rhinorrhea Followed by ENT and Neurosurgeon S/p shunt placement Headaches better now, still has rhinorrhea, but manageable according to the patient - followed by Neurology 

## 2023-08-22 NOTE — Assessment & Plan Note (Addendum)
Smokes about 0.5 pack/day  Asked about quitting: confirms that she currently smokes cigarettes Advise to quit smoking: Educated about QUITTING to reduce the risk of cancer, cardio and cerebrovascular disease. Assess willingness: Unwilling to quit at this time, but is working on cutting back. Arrange for follow up: Follow up in 4 months and continue to offer help.

## 2023-08-22 NOTE — Assessment & Plan Note (Signed)
BMI Readings from Last 3 Encounters:  08/22/23 32.55 kg/m  04/20/23 31.00 kg/m  12/08/22 29.65 kg/m   Initial BMI - 36.78 - on Wegovy for weight loss Diet modification and moderate exercise, continue to follow low carb diet Had lost 41 lbs with [id number], had to stop due to insurance coverage concern  She would benefit from continued medical help with weight loss, started Qsymia - plan to increase dose as tolerated

## 2023-08-22 NOTE — Patient Instructions (Signed)
Please start taking Phentermine half tablet for 2 weeks and then 1 tablet once daily.  Please continue to take medications as prescribed.  Please continue to follow low carb diet and perform moderate exercise/walking at least 150 mins/week.

## 2023-08-22 NOTE — Assessment & Plan Note (Signed)
Advised to follow low carb diet for now 

## 2023-08-22 NOTE — Assessment & Plan Note (Signed)
Persistent symptoms despite symptomatic treatment Prescribed empiric azithromycin, advised to start if persistent symptoms after 2 days Nasal saline spray as needed for nasal congestion DayQuil/NyQuil as needed

## 2023-08-22 NOTE — Assessment & Plan Note (Addendum)
Physical exam as documented. Fasting blood tests today. Has had Tdap vaccine - date unknown, denies today. PAP smear with OB/GYN - Dr. Billy Coast.

## 2023-08-22 NOTE — Assessment & Plan Note (Signed)
Had persistent constipation despite using Colace/Dulcolax, MiraLAX and Benefiber On Linzess now, improved, takes PRN Advised to maintain adequate hydration Can try prune juice

## 2023-08-22 NOTE — Progress Notes (Signed)
Established Patient Office Visit  Subjective:  Patient ID: Paula Burgess, female    DOB: 1986/02/11  Age: 37 y.o. MRN: [id number]  CC:  Chief Complaint  Patient presents with   Annual Exam   Sinusitis    Sinus infection for four days     HPI Paula Burgess is a 37 y.o. female with past medical history of IIH s/p shunt placement, CSF rhinorrhea and tobacco abuse who presents for annual physical.  Obesity: She had lost about 41 lbs with Wegovy. Later, she was placed on Qsymia, but still has gained about 15 lbs since the last visit. She has had difficulty obtaining it as well. She has been trying to follow low-carb diet and exercises regularly.  She has had shunt placement for IIH. Her headaches and CSF rhinorrhea have improved now. She used to follow up with Neurosurgeon and ENT specialist.  She has stopped taking Topamax as her migraine has improved now.  She reports nasal congestion, postnasal drip and sore throat for the last 4-5 days. She also has dry cough, but denies any fever, chills, dyspnea or wheezing. She has tried sinus rinse and Dayquil/Nyquil with mild relief.  Past Medical History:  Diagnosis Date   GERD (gastroesophageal reflux disease)    Intracranial hypertension     Past Surgical History:  Procedure Laterality Date   ceribrial stent     CESAREAN SECTION  2012   CESAREAN SECTION N/A 02/12/2014   Procedure: Repeat CESAREAN SECTION;  Surgeon: Lenoard Aden, MD;  Location: [facility];  Service: Obstetrics;  Laterality: N/A;   DILATION AND EVACUATION N/A 01/23/2021   Procedure: DILATATION AND EVACUATION;  Surgeon: Toy Baker, DO;  Location: [facility];  Service: Gynecology;  Laterality: N/A;    Family History  Problem Relation Age of Onset   Healthy Mother    Healthy Father    Breast cancer Maternal Grandmother    Diabetes Maternal Grandmother    Diabetes Maternal Grandfather    Diabetes Paternal Grandmother    Diabetes Paternal Grandfather     Migraines Neg Hx    Pseudotumor cerebri Neg Hx    Heart failure Neg Hx     Social History   Socioeconomic History   Marital status: Divorced    Spouse name: Not on file   Number of children: 2   Years of education: Not on file   Highest education level: Associate degree: occupational, Scientist, product/process development, or vocational program  Occupational History   Not on file  Tobacco Use   Smoking status: Every Day    Current packs/day: 0.50    Average packs/day: 0.5 packs/day for 18.9 years (9.4 ttl pk-yrs)    Types: Cigarettes    Start date: 2006   Smokeless tobacco: Never  Vaping Use   Vaping status: Never Used  Substance and Sexual Activity   Alcohol use: Yes    Comment: occ   Drug use: No   Sexual activity: Not on file  Other Topics Concern   Not on file  Social History Narrative   ** Merged History Encounter **       Lives with her children Right handed Caffeine: maybe 3 cups/day   Social Determinants of Health   Financial Resource Strain: Low Risk  (04/19/2023)   Overall Financial Resource Strain (CARDIA)    Difficulty of Paying Living Expenses: Not hard at all  Food Insecurity: No Food Insecurity (04/19/2023)   Hunger Vital Sign    Worried About Running Out of  Food in the Last Year: Never true    Ran Out of Food in the Last Year: Never true  Transportation Needs: No Transportation Needs (04/19/2023)   PRAPARE - Administrator, Civil Service (Medical): No    Lack of Transportation (Non-Medical): No  Physical Activity: Insufficiently Active (04/19/2023)   Exercise Vital Sign    Days of Exercise per Week: 2 days    Minutes of Exercise per Session: 30 min  Stress: No Stress Concern Present (04/19/2023)   [employer] of Occupational Health - Occupational Stress Questionnaire    Feeling of Stress : Not at all  Social Connections: Moderately Integrated (04/19/2023)   Social Connection and Isolation Panel [NHANES]    Frequency of Communication with Friends and Family:  More than three times a week    Frequency of Social Gatherings with Friends and Family: Once a week    Attends Religious Services: 1 to 4 times per year    Active Member of [employer] or Organizations: Yes    Attends Engineer, structural: More than 4 times per year    Marital Status: Divorced  Catering manager Violence: Not on file    Outpatient Medications Prior to Visit  Medication Sig Dispense Refill   chlorhexidine (PERIDEX) 0.12 % solution RINSE MOUTH WITH (1 CAPFUL) FOR 30 SECONDS MORNING AND EVENING AFTER TOOTHBRUSHING. EXPECTORATE AFTER RINSING, DO NOT SWALLOW 473 mL 2   ibuprofen (ADVIL) 800 MG tablet TAKE 1 TABLET BY MOUTH EVERY 6 HOURS WITH FOOD. 12 tablet 0   linaclotide (LINZESS) 72 MCG capsule Take 1 capsule (72 mcg total) by mouth daily before breakfast. 30 capsule 3   rizatriptan (MAXALT-MLT) 10 MG disintegrating tablet Dissolve 1 tablet (10 mg total) by mouth as needed for migraine. May repeat in 2 hours if needed 9 tablet 11   Phentermine-Topiramate (QSYMIA) 11.25-69 MG CP24 Take 1 capsule by mouth daily. 30 capsule 0   Phentermine-Topiramate (QSYMIA) 7.5-46 MG CP24 Take 1 capsule by mouth daily. 30 capsule 0   No facility-administered medications prior to visit.    No Known Allergies  ROS Review of Systems  Constitutional:  Negative for chills and fever.  HENT:  Positive for congestion, postnasal drip, sinus pressure and sore throat.   Eyes:  Negative for pain and discharge.  Respiratory:  Positive for cough. Negative for shortness of breath.   Cardiovascular:  Negative for chest pain and palpitations.  Gastrointestinal:  Negative for abdominal pain, diarrhea, nausea and vomiting.  Endocrine: Negative for polydipsia and polyuria.  Genitourinary:  Negative for dysuria and hematuria.  Musculoskeletal:  Negative for neck pain and neck stiffness.  Skin:  Negative for rash.  Neurological:  Negative for weakness and headaches.  Psychiatric/Behavioral:   Negative for agitation and behavioral problems.       Objective:    Physical Exam Vitals reviewed.  Constitutional:      General: She is not in acute distress.    Appearance: She is obese. She is not diaphoretic.  HENT:     Head: Normocephalic.     Nose: Congestion present.     Right Sinus: Frontal sinus tenderness present.     Left Sinus: Frontal sinus tenderness present.     Mouth/Throat:     Mouth: Mucous membranes are moist.  Eyes:     General: No scleral icterus.    Extraocular Movements: Extraocular movements intact.  Cardiovascular:     Rate and Rhythm: Normal rate and regular rhythm.  Pulses: Normal pulses.     Heart sounds: Normal heart sounds. No murmur heard. Pulmonary:     Breath sounds: Normal breath sounds. No wheezing or rales.  Abdominal:     Palpations: Abdomen is soft.     Tenderness: There is no abdominal tenderness.  Musculoskeletal:     Cervical back: Neck supple. No tenderness.     Right lower leg: No edema.     Left lower leg: No edema.  Skin:    General: Skin is warm.     Findings: No rash.  Neurological:     General: No focal deficit present.     Mental Status: She is alert and oriented to person, place, and time.     Cranial Nerves: No cranial nerve deficit.     Sensory: No sensory deficit.     Motor: No weakness.  Psychiatric:        Mood and Affect: Mood normal.        Behavior: Behavior normal.     BP 129/88 (BP Location: Right Arm, Patient Position: Sitting, Cuff Size: Normal)   Pulse 97   Ht 5\' 5"  (1.651 m)   Wt 195 lb 9.6 oz (88.7 kg)   SpO2 98%   BMI 32.55 kg/m  Wt Readings from Last 3 Encounters:  08/22/23 195 lb 9.6 oz (88.7 kg)  04/20/23 180 lb 9.6 oz (81.9 kg)  12/08/22 178 lb 3.2 oz (80.8 kg)    Lab Results  Component Value Date   TSH 0.974 07/12/2022   Lab Results  Component Value Date   WBC 9.3 07/12/2022   HGB 13.8 07/12/2022   HCT 42.1 07/12/2022   MCV 92 07/12/2022   PLT 306 07/12/2022   Lab  Results  Component Value Date   NA 136 07/12/2022   K 4.3 07/12/2022   CO2 20 07/12/2022   GLUCOSE 88 07/12/2022   BUN 10 07/12/2022   CREATININE 0.75 07/12/2022   BILITOT 0.3 07/12/2022   ALKPHOS 52 07/12/2022   AST 13 07/12/2022   ALT 10 07/12/2022   PROT 6.4 07/12/2022   ALBUMIN 4.5 07/12/2022   CALCIUM 9.7 07/12/2022   EGFR 105 07/12/2022   Lab Results  Component Value Date   CHOL 173 07/12/2022   Lab Results  Component Value Date   HDL 45 07/12/2022   Lab Results  Component Value Date   LDLCALC 115 (H) 07/12/2022   Lab Results  Component Value Date   TRIG 65 07/12/2022   Lab Results  Component Value Date   CHOLHDL 3.8 07/12/2022   Lab Results  Component Value Date   HGBA1C 5.2 07/12/2022      Assessment & Plan:   Problem List Items Addressed This Visit       Respiratory   Acute sinusitis    Persistent symptoms despite symptomatic treatment Prescribed empiric azithromycin, advised to start if persistent symptoms after 2 days Nasal saline spray as needed for nasal congestion DayQuil/NyQuil as needed      Relevant Medications   azithromycin (ZITHROMAX) 250 MG tablet   Other Relevant Orders   CBC with Differential/Platelet     Digestive   Chronic idiopathic constipation    Had persistent constipation despite using Colace/Dulcolax, MiraLAX and Benefiber On Linzess now, improved, takes PRN Advised to maintain adequate hydration Can try prune juice      Relevant Orders   TSH   CBC with Differential/Platelet     Nervous and Auditory   IIH (idiopathic intracranial hypertension)  With CSF rhinorrhea Followed by ENT and Neurosurgeon S/p shunt placement Headaches better now, still has rhinorrhea, but manageable according to the patient - followed by Neurology      Relevant Orders   TSH   CMP14+EGFR   CBC with Differential/Platelet     Other   Tobacco abuse    Smokes about 0.5 pack/day  Asked about quitting: confirms that she  currently smokes cigarettes Advise to quit smoking: Educated about QUITTING to reduce the risk of cancer, cardio and cerebrovascular disease. Assess willingness: Unwilling to quit at this time, but is working on cutting back. Arrange for follow up: Follow up in 4 months and continue to offer help.      Obesity (BMI 30-39.9)    BMI Readings from Last 3 Encounters:  08/22/23 32.55 kg/m  04/20/23 31.00 kg/m  12/08/22 29.65 kg/m   Initial BMI - 36.78 - on Wegovy for weight loss Diet modification and moderate exercise, continue to follow low carb diet Had lost 41 lbs with Wegovy, had to stop due to insurance coverage concern  She would benefit from continued medical help with weight loss, started Qsymia - plan to increase dose as tolerated      Relevant Medications   phentermine (ADIPEX-P) 37.5 MG tablet   Encounter for general adult medical examination with abnormal findings - Primary    Physical exam as documented. Fasting blood tests today. Has had Tdap vaccine - date unknown, denies today. PAP smear with OB/GYN - Dr. Billy Coast.      Mixed hyperlipidemia    Advised to follow low carb diet for now      Relevant Orders   Lipid panel   Other Visit Diagnoses     Vitamin D deficiency       Relevant Orders   VITAMIN D 25 Hydroxy (Vit-D Deficiency, Fractures)   Hyperglycemia       Relevant Orders   Hemoglobin A1c   CMP14+EGFR       Meds ordered this encounter  Medications   phentermine (ADIPEX-P) 37.5 MG tablet    Sig: Take 1 tablet (37.5 mg total) by mouth daily before breakfast.    Dispense:  30 tablet    Refill:  3   azithromycin (ZITHROMAX) 250 MG tablet    Sig: Take 2 tablets on day 1, then 1 tablet daily on days 2 through 5    Dispense:  6 tablet    Refill:  0    Follow-up: Return in about 4 months (around 12/20/2023).    Anabel Halon, MD

## 2023-08-23 LAB — CMP14+EGFR
ALT: 22 [IU]/L (ref 0–32)
AST: 19 [IU]/L (ref 0–40)
Albumin: 4.6 g/dL (ref 3.9–4.9)
Alkaline Phosphatase: 68 [IU]/L (ref 44–121)
BUN/Creatinine Ratio: 14 (ref 9–23)
BUN: 11 mg/dL (ref 6–20)
Bilirubin Total: 0.3 mg/dL (ref 0.0–1.2)
CO2: 21 mmol/L (ref 20–29)
Calcium: 9.1 mg/dL (ref 8.7–10.2)
Chloride: 104 mmol/L (ref 96–106)
Creatinine, Ser: 0.79 mg/dL (ref 0.57–1.00)
Globulin, Total: 2.4 g/dL (ref 1.5–4.5)
Glucose: 87 mg/dL (ref 70–99)
Potassium: 4.3 mmol/L (ref 3.5–5.2)
Sodium: 140 mmol/L (ref 134–144)
Total Protein: 7 g/dL (ref 6.0–8.5)
eGFR: 98 mL/min/{1.73_m2} (ref >59)

## 2023-08-23 LAB — LIPID PANEL
Chol/HDL Ratio: 3.2 ratio (ref 0.0–4.4)
Cholesterol, Total: 180 mg/dL (ref 100–199)
HDL: 57 mg/dL (ref >39)
LDL Chol Calc (NIH): 105 mg/dL — ABNORMAL HIGH (ref 0–99)
Triglycerides: 101 mg/dL (ref 0–149)
VLDL Cholesterol Cal: 18 mg/dL (ref 5–40)

## 2023-08-23 LAB — HEMOGLOBIN A1C
Est. average glucose Bld gHb Est-mCnc: 114 mg/dL
Hgb A1c MFr Bld: 5.6 % (ref 4.8–5.6)

## 2023-08-23 LAB — CBC WITH DIFFERENTIAL/PLATELET
Basophils Absolute: 0 10*3/uL (ref 0.0–0.2)
Basos: 0 %
EOS (ABSOLUTE): 0.1 10*3/uL (ref 0.0–0.4)
Eos: 1 %
Hematocrit: 44.2 % (ref 34.0–46.6)
Hemoglobin: 14.9 g/dL (ref 11.1–15.9)
Immature Grans (Abs): 0 10*3/uL (ref 0.0–0.1)
Immature Granulocytes: 0 %
Lymphocytes Absolute: 2.4 10*3/uL (ref 0.7–3.1)
Lymphs: 27 %
MCH: 30.8 pg (ref 26.6–33.0)
MCHC: 33.7 g/dL (ref 31.5–35.7)
MCV: 91 fL (ref 79–97)
Monocytes Absolute: 0.9 10*3/uL (ref 0.1–0.9)
Monocytes: 10 %
Neutrophils Absolute: 5.4 10*3/uL (ref 1.4–7.0)
Neutrophils: 62 %
Platelets: 275 10*3/uL (ref 150–450)
RBC: 4.84 x10E6/uL (ref 3.77–5.28)
RDW: 13 % (ref 11.7–15.4)
WBC: 8.8 10*3/uL (ref 3.4–10.8)

## 2023-08-23 LAB — TSH: TSH: 0.888 u[IU]/mL (ref 0.450–4.500)

## 2023-08-23 LAB — VITAMIN D 25 HYDROXY (VIT D DEFICIENCY, FRACTURES): Vit D, 25-Hydroxy: 22.6 ng/mL — ABNORMAL LOW (ref 30.0–100.0)

## 2023-08-29 ENCOUNTER — Other Ambulatory Visit (HOSPITAL_COMMUNITY): Payer: Self-pay

## 2023-09-12 DIAGNOSIS — Z113 Encounter for screening for infections with a predominantly sexual mode of transmission: Secondary | ICD-10-CM | POA: Diagnosis not present

## 2023-09-12 DIAGNOSIS — Z01419 Encounter for gynecological examination (general) (routine) without abnormal findings: Secondary | ICD-10-CM | POA: Diagnosis not present

## 2023-09-12 DIAGNOSIS — Z124 Encounter for screening for malignant neoplasm of cervix: Secondary | ICD-10-CM | POA: Diagnosis not present

## 2023-09-12 DIAGNOSIS — Z1331 Encounter for screening for depression: Secondary | ICD-10-CM | POA: Diagnosis not present

## 2023-09-12 DIAGNOSIS — Z01411 Encounter for gynecological examination (general) (routine) with abnormal findings: Secondary | ICD-10-CM | POA: Diagnosis not present

## 2023-11-01 ENCOUNTER — Other Ambulatory Visit (HOSPITAL_COMMUNITY): Payer: Self-pay

## 2023-11-02 ENCOUNTER — Other Ambulatory Visit: Payer: Self-pay

## 2023-11-04 ENCOUNTER — Other Ambulatory Visit (HOSPITAL_COMMUNITY): Payer: Self-pay

## 2023-11-07 ENCOUNTER — Other Ambulatory Visit (HOSPITAL_COMMUNITY): Payer: Self-pay

## 2023-11-07 ENCOUNTER — Other Ambulatory Visit (HOSPITAL_BASED_OUTPATIENT_CLINIC_OR_DEPARTMENT_OTHER): Payer: Self-pay

## 2023-11-10 ENCOUNTER — Other Ambulatory Visit: Payer: Self-pay

## 2023-12-04 ENCOUNTER — Other Ambulatory Visit: Payer: Self-pay

## 2023-12-22 ENCOUNTER — Other Ambulatory Visit (HOSPITAL_COMMUNITY): Payer: Self-pay

## 2023-12-22 ENCOUNTER — Ambulatory Visit: Payer: 59 | Admitting: Internal Medicine

## 2023-12-22 VITALS — BP 119/83 | HR 91 | Ht 65.0 in | Wt 206.2 lb

## 2023-12-22 DIAGNOSIS — G932 Benign intracranial hypertension: Secondary | ICD-10-CM

## 2023-12-22 DIAGNOSIS — E669 Obesity, unspecified: Secondary | ICD-10-CM | POA: Diagnosis not present

## 2023-12-22 DIAGNOSIS — K5904 Chronic idiopathic constipation: Secondary | ICD-10-CM

## 2023-12-22 MED ORDER — PHENTERMINE HCL 37.5 MG PO TABS
37.5000 mg | ORAL_TABLET | Freq: Every day | ORAL | 3 refills | Status: DC
  Filled 2023-12-22 – 2024-02-01 (×2): qty 30, 30d supply, fill #0
  Filled 2024-03-22: qty 30, 30d supply, fill #1

## 2023-12-22 NOTE — Assessment & Plan Note (Signed)
 BMI Readings from Last 3 Encounters:  12/22/23 34.31 kg/m  08/22/23 32.55 kg/m  04/20/23 31.00 kg/m   Initial BMI - 36.78 - was on Wegovy for weight loss Diet modification and moderate exercise, continue to follow low carb diet Had lost 41 lbs with ONGEXB, had to stop due to insurance coverage concern  She would benefit from continued medical help with weight loss, continue phentermine

## 2023-12-22 NOTE — Patient Instructions (Signed)
 Please continue to take medications as prescribed.  Please continue to follow low carb diet and perform moderate exercise/walking at least 150 mins/week.

## 2023-12-22 NOTE — Assessment & Plan Note (Addendum)
 With CSF rhinorrhea Followed by ENT and Neurosurgeon S/p venous sinus stent placement Headaches better now, still has rhinorrhea, but manageable according to the patient - used to be followed by Neurology

## 2023-12-22 NOTE — Assessment & Plan Note (Signed)
 Had persistent constipation despite using Colace/Dulcolax, MiraLAX and Benefiber On Linzess now, improved, takes PRN Advised to maintain adequate hydration Can try prune juice

## 2023-12-22 NOTE — Progress Notes (Signed)
 Established Patient Office Visit  Subjective:  Patient ID: Paula Burgess, female    DOB: 1985/12/18  Age: 38 y.o. MRN: [id number]  CC:  Chief Complaint  Patient presents with   Care Management    4 month f/u    HPI Paula Burgess is a 38 y.o. female with past medical history of IIH s/p shunt placement, CSF rhinorrhea and tobacco abuse who presents for annual physical.  Obesity:  She has been trying to follow low-carb diet and exercises regularly. She had lost about 41 lbs with Wegovy. Later, she was placed on Qsymia, but still has gained about 15 lbs since the last visit. She had difficulty obtaining it as well. She was later given Phentermine, but has had difficulty taking it regularly due to her work schedule.  She prefers to continue phentermine, but agrees to take it regularly now.  She has had stent placement for IIH. Her headaches and CSF rhinorrhea have improved now. She used to follow up with Neurosurgeon and ENT specialist.  She has stopped taking Topamax as her migraine has improved now.   Past Medical History:  Diagnosis Date   GERD (gastroesophageal reflux disease)    Intracranial hypertension     Past Surgical History:  Procedure Laterality Date   ceribrial stent     CESAREAN SECTION  2012   CESAREAN SECTION N/A 02/12/2014   Procedure: Repeat CESAREAN SECTION;  Surgeon: Lenoard Aden, MD;  Location: [facility];  Service: Obstetrics;  Laterality: N/A;   DILATION AND EVACUATION N/A 01/23/2021   Procedure: DILATATION AND EVACUATION;  Surgeon: Toy Baker, DO;  Location: [facility];  Service: Gynecology;  Laterality: N/A;    Family History  Problem Relation Age of Onset   Healthy Mother    Healthy Father    Breast cancer Maternal Grandmother    Diabetes Maternal Grandmother    Diabetes Maternal Grandfather    Diabetes Paternal Grandmother    Diabetes Paternal Grandfather    Migraines Neg Hx    Pseudotumor cerebri Neg Hx    Heart failure Neg Hx      Social History   Socioeconomic History   Marital status: Divorced    Spouse name: Not on file   Number of children: 2   Years of education: Not on file   Highest education level: Associate degree: occupational, Scientist, product/process development, or vocational program  Occupational History   Not on file  Tobacco Use   Smoking status: Every Day    Current packs/day: 0.50    Average packs/day: 0.5 packs/day for 19.2 years (9.6 ttl pk-yrs)    Types: Cigarettes    Start date: 2006   Smokeless tobacco: Never   Tobacco comments:    1/2 ppd as of 12/22/23  Vaping Use   Vaping status: Never Used  Substance and Sexual Activity   Alcohol use: Yes    Comment: occ   Drug use: No   Sexual activity: Not on file  Other Topics Concern   Not on file  Social History Narrative   ** Merged History Encounter **       Lives with her children Right handed Caffeine: maybe 3 cups/day   Social Drivers of Corporate investment banker Strain: Low Risk  (12/22/2023)   Overall Financial Resource Strain (CARDIA)    Difficulty of Paying Living Expenses: Not very hard  Food Insecurity: No Food Insecurity (12/22/2023)   Hunger Vital Sign    Worried About Running Out of Food in  the Last Year: Never true    Ran Out of Food in the Last Year: Never true  Transportation Needs: No Transportation Needs (12/22/2023)   PRAPARE - Administrator, Civil Service (Medical): No    Lack of Transportation (Non-Medical): No  Physical Activity: Insufficiently Active (12/22/2023)   Exercise Vital Sign    Days of Exercise per Week: 2 days    Minutes of Exercise per Session: 30 min  Stress: Stress Concern Present (12/22/2023)   [employer] of Occupational Health - Occupational Stress Questionnaire    Feeling of Stress : To some extent  Social Connections: Socially Integrated (12/22/2023)   Social Connection and Isolation Panel [NHANES]    Frequency of Communication with Friends and Family: More than three times a week     Frequency of Social Gatherings with Friends and Family: Once a week    Attends Religious Services: 1 to 4 times per year    Active Member of [employer] or Organizations: Yes    Attends Engineer, structural: More than 4 times per year    Marital Status: Living with partner  Intimate Partner Violence: Not on file    Outpatient Medications Prior to Visit  Medication Sig Dispense Refill   chlorhexidine (PERIDEX) 0.12 % solution RINSE MOUTH WITH (1 CAPFUL) FOR 30 SECONDS MORNING AND EVENING AFTER TOOTHBRUSHING. EXPECTORATE AFTER RINSING, DO NOT SWALLOW 473 mL 2   ibuprofen (ADVIL) 800 MG tablet TAKE 1 TABLET BY MOUTH EVERY 6 HOURS WITH FOOD. 12 tablet 0   linaclotide (LINZESS) 72 MCG capsule Take 1 capsule (72 mcg total) by mouth daily before breakfast. 30 capsule 3   rizatriptan (MAXALT-MLT) 10 MG disintegrating tablet Dissolve 1 tablet (10 mg total) by mouth as needed for migraine. May repeat in 2 hours if needed 9 tablet 11   phentermine (ADIPEX-P) 37.5 MG tablet Take 1 tablet (37.5 mg total) by mouth daily before breakfast. 30 tablet 3   No facility-administered medications prior to visit.    No Known Allergies  ROS Review of Systems  Constitutional:  Negative for chills and fever.  HENT:  Negative for congestion and sore throat.   Eyes:  Negative for pain and discharge.  Respiratory:  Negative for cough and shortness of breath.   Cardiovascular:  Negative for chest pain and palpitations.  Gastrointestinal:  Negative for abdominal pain, diarrhea, nausea and vomiting.  Endocrine: Negative for polydipsia and polyuria.  Genitourinary:  Negative for dysuria and hematuria.  Musculoskeletal:  Negative for neck pain and neck stiffness.  Skin:  Negative for rash.  Neurological:  Negative for weakness and headaches.  Psychiatric/Behavioral:  Negative for agitation and behavioral problems.       Objective:    Physical Exam Vitals reviewed.  Constitutional:      General: She  is not in acute distress.    Appearance: She is obese. She is not diaphoretic.  HENT:     Head: Normocephalic.     Nose: No congestion.     Right Sinus: No frontal sinus tenderness.     Left Sinus: No frontal sinus tenderness.     Mouth/Throat:     Mouth: Mucous membranes are moist.  Eyes:     General: No scleral icterus.    Extraocular Movements: Extraocular movements intact.  Cardiovascular:     Rate and Rhythm: Normal rate and regular rhythm.     Heart sounds: Normal heart sounds. No murmur heard. Pulmonary:     Breath sounds: Normal  breath sounds. No wheezing or rales.  Musculoskeletal:     Cervical back: Neck supple. No tenderness.     Right lower leg: No edema.     Left lower leg: No edema.  Skin:    General: Skin is warm.     Findings: No rash.  Neurological:     General: No focal deficit present.     Mental Status: She is alert and oriented to person, place, and time.     Sensory: No sensory deficit.     Motor: No weakness.  Psychiatric:        Mood and Affect: Mood normal.        Behavior: Behavior normal.     BP 119/83   Pulse 91   Ht 5\' 5"  (1.651 m)   Wt 206 lb 3.2 oz (93.5 kg)   SpO2 98%   BMI 34.31 kg/m  Wt Readings from Last 3 Encounters:  12/22/23 206 lb 3.2 oz (93.5 kg)  08/22/23 195 lb 9.6 oz (88.7 kg)  04/20/23 180 lb 9.6 oz (81.9 kg)    Lab Results  Component Value Date   TSH 0.888 08/22/2023   Lab Results  Component Value Date   WBC 8.8 08/22/2023   HGB 14.9 08/22/2023   HCT 44.2 08/22/2023   MCV 91 08/22/2023   PLT 275 08/22/2023   Lab Results  Component Value Date   NA 140 08/22/2023   K 4.3 08/22/2023   CO2 21 08/22/2023   GLUCOSE 87 08/22/2023   BUN 11 08/22/2023   CREATININE 0.79 08/22/2023   BILITOT 0.3 08/22/2023   ALKPHOS 68 08/22/2023   AST 19 08/22/2023   ALT 22 08/22/2023   PROT 7.0 08/22/2023   ALBUMIN 4.6 08/22/2023   CALCIUM 9.1 08/22/2023   EGFR 98 08/22/2023   Lab Results  Component Value Date   CHOL  180 08/22/2023   Lab Results  Component Value Date   HDL 57 08/22/2023   Lab Results  Component Value Date   LDLCALC 105 (H) 08/22/2023   Lab Results  Component Value Date   TRIG 101 08/22/2023   Lab Results  Component Value Date   CHOLHDL 3.2 08/22/2023   Lab Results  Component Value Date   HGBA1C 5.6 08/22/2023      Assessment & Plan:   Problem List Items Addressed This Visit       Digestive   Chronic idiopathic constipation   Had persistent constipation despite using Colace/Dulcolax, MiraLAX and Benefiber On Linzess now, improved, takes PRN Advised to maintain adequate hydration Can try prune juice        Nervous and Auditory   IIH (idiopathic intracranial hypertension)   With CSF rhinorrhea Followed by ENT and Neurosurgeon S/p venous sinus stent placement Headaches better now, still has rhinorrhea, but manageable according to the patient - used to be followed by Neurology        Other   Obesity (BMI 30-39.9) - Primary   BMI Readings from Last 3 Encounters:  12/22/23 34.31 kg/m  08/22/23 32.55 kg/m  04/20/23 31.00 kg/m   Initial BMI - 36.78 - was on Wegovy for weight loss Diet modification and moderate exercise, continue to follow low carb diet Had lost 41 lbs with Wegovy, had to stop due to insurance coverage concern  She would benefit from continued medical help with weight loss, continue phentermine      Relevant Medications   phentermine (ADIPEX-P) 37.5 MG tablet     Meds ordered this  encounter  Medications   phentermine (ADIPEX-P) 37.5 MG tablet    Sig: Take 1 tablet (37.5 mg total) by mouth daily before breakfast.    Dispense:  30 tablet    Refill:  3    Follow-up: Return in about 4 months (around 04/22/2024) for Weight management.    Anabel Halon, MD

## 2024-02-02 ENCOUNTER — Other Ambulatory Visit (HOSPITAL_COMMUNITY): Payer: Self-pay

## 2024-02-02 ENCOUNTER — Other Ambulatory Visit: Payer: Self-pay

## 2024-02-05 ENCOUNTER — Other Ambulatory Visit (HOSPITAL_COMMUNITY): Payer: Self-pay

## 2024-03-22 ENCOUNTER — Other Ambulatory Visit: Payer: Self-pay

## 2024-04-22 ENCOUNTER — Ambulatory Visit: Admitting: Internal Medicine

## 2024-06-22 ENCOUNTER — Other Ambulatory Visit: Payer: Self-pay | Admitting: Internal Medicine

## 2024-06-22 DIAGNOSIS — E669 Obesity, unspecified: Secondary | ICD-10-CM

## 2024-06-24 ENCOUNTER — Encounter (HOSPITAL_COMMUNITY): Payer: Self-pay

## 2024-06-24 ENCOUNTER — Other Ambulatory Visit (HOSPITAL_COMMUNITY): Payer: Self-pay

## 2024-07-08 NOTE — Telephone Encounter (Signed)
 Pt phoned in today. She wanted to know if we coud order a ct scan for her before next Wed. 08/05/2020 due to she has an appointment were we referred er to in [facility], since she has [employer].

## 2024-07-11 DIAGNOSIS — H16223 Keratoconjunctivitis sicca, not specified as Sjogren's, bilateral: Secondary | ICD-10-CM | POA: Diagnosis not present

## 2024-07-11 DIAGNOSIS — H0288A Meibomian gland dysfunction right eye, upper and lower eyelids: Secondary | ICD-10-CM | POA: Diagnosis not present

## 2024-07-11 DIAGNOSIS — H179 Unspecified corneal scar and opacity: Secondary | ICD-10-CM | POA: Diagnosis not present

## 2024-07-11 DIAGNOSIS — H0288B Meibomian gland dysfunction left eye, upper and lower eyelids: Secondary | ICD-10-CM | POA: Diagnosis not present

## 2024-07-11 DIAGNOSIS — G932 Benign intracranial hypertension: Secondary | ICD-10-CM | POA: Diagnosis not present

## 2024-09-17 ENCOUNTER — Ambulatory Visit: Admitting: Orthopedic Surgery

## 2024-09-20 ENCOUNTER — Ambulatory Visit: Admitting: Orthopedic Surgery

## 2024-11-12 ENCOUNTER — Ambulatory Visit: Admitting: Internal Medicine

## 2024-11-12 ENCOUNTER — Encounter: Payer: Self-pay | Admitting: Internal Medicine

## 2024-11-12 ENCOUNTER — Other Ambulatory Visit (HOSPITAL_BASED_OUTPATIENT_CLINIC_OR_DEPARTMENT_OTHER): Payer: Self-pay

## 2024-11-12 VITALS — BP 107/74 | HR 78 | Ht 65.0 in | Wt 238.0 lb

## 2024-11-12 DIAGNOSIS — E782 Mixed hyperlipidemia: Secondary | ICD-10-CM

## 2024-11-12 DIAGNOSIS — E559 Vitamin D deficiency, unspecified: Secondary | ICD-10-CM

## 2024-11-12 DIAGNOSIS — G932 Benign intracranial hypertension: Secondary | ICD-10-CM

## 2024-11-12 DIAGNOSIS — E669 Obesity, unspecified: Secondary | ICD-10-CM

## 2024-11-12 DIAGNOSIS — R739 Hyperglycemia, unspecified: Secondary | ICD-10-CM

## 2024-11-12 DIAGNOSIS — G8929 Other chronic pain: Secondary | ICD-10-CM | POA: Insufficient documentation

## 2024-11-12 DIAGNOSIS — K5904 Chronic idiopathic constipation: Secondary | ICD-10-CM

## 2024-11-12 MED ORDER — WEGOVY 1.5 MG PO TABS
1.5000 mg | ORAL_TABLET | Freq: Every day | ORAL | 0 refills | Status: AC
  Filled 2024-11-12: qty 30, 30d supply, fill #0

## 2024-11-12 MED ORDER — WEGOVY 4 MG PO TABS
4.0000 mg | ORAL_TABLET | Freq: Every day | ORAL | 0 refills | Status: AC
  Filled 2024-11-12: qty 30, 30d supply, fill #0

## 2024-11-12 NOTE — Assessment & Plan Note (Signed)
 Had persistent constipation despite using Colace/Dulcolax, MiraLAX and Benefiber On Linzess now, improved, takes PRN Advised to maintain adequate hydration Can try prune juice

## 2024-11-12 NOTE — Patient Instructions (Signed)
 Please start taking Wegovy  1.5 mg once daily for 1 month, and then 4 mg after 1 month.  Please continue to take medications as prescribed.  Please continue to follow low carb diet and perform moderate exercise/walking at least 150 mins/week.  Please get fasting blood tests done before the next visit.

## 2025-01-15 ENCOUNTER — Ambulatory Visit: Payer: Self-pay | Admitting: Internal Medicine
